# Patient Record
Sex: Female | Born: 2000 | Race: Black or African American | Hispanic: No | Marital: Single | Smoking: Never smoker
Health system: Southern US, Community
[De-identification: ages and names within clinical notes are randomized; demographics above are authoritative.]

## PROBLEM LIST (undated history)

## (undated) ENCOUNTER — Inpatient Hospital Stay (HOSPITAL_COMMUNITY): Payer: Self-pay

## (undated) VITALS — BP 130/87 | HR 89 | Temp 97.0°F | Resp 16 | Ht 59.06 in | Wt 125.7 lb

## (undated) VITALS — BP 118/87 | HR 88 | Temp 98.5°F | Resp 14 | Ht 59.06 in | Wt 119.5 lb

## (undated) DIAGNOSIS — Z9889 Other specified postprocedural states: Secondary | ICD-10-CM

## (undated) DIAGNOSIS — F319 Bipolar disorder, unspecified: Secondary | ICD-10-CM

## (undated) DIAGNOSIS — E669 Obesity, unspecified: Secondary | ICD-10-CM

## (undated) DIAGNOSIS — E301 Precocious puberty: Secondary | ICD-10-CM

## (undated) DIAGNOSIS — L83 Acanthosis nigricans: Secondary | ICD-10-CM

## (undated) DIAGNOSIS — N83202 Unspecified ovarian cyst, left side: Secondary | ICD-10-CM

## (undated) DIAGNOSIS — R1013 Epigastric pain: Secondary | ICD-10-CM

## (undated) DIAGNOSIS — F4310 Post-traumatic stress disorder, unspecified: Secondary | ICD-10-CM

## (undated) DIAGNOSIS — R112 Nausea with vomiting, unspecified: Secondary | ICD-10-CM

## (undated) DIAGNOSIS — F913 Oppositional defiant disorder: Secondary | ICD-10-CM

## (undated) DIAGNOSIS — L309 Dermatitis, unspecified: Secondary | ICD-10-CM

## (undated) DIAGNOSIS — T7840XA Allergy, unspecified, initial encounter: Secondary | ICD-10-CM

## (undated) DIAGNOSIS — F32A Depression, unspecified: Secondary | ICD-10-CM

## (undated) DIAGNOSIS — J302 Other seasonal allergic rhinitis: Secondary | ICD-10-CM

## (undated) DIAGNOSIS — F419 Anxiety disorder, unspecified: Secondary | ICD-10-CM

## (undated) DIAGNOSIS — K219 Gastro-esophageal reflux disease without esophagitis: Secondary | ICD-10-CM

## (undated) DIAGNOSIS — F329 Major depressive disorder, single episode, unspecified: Secondary | ICD-10-CM

## (undated) HISTORY — DX: Acanthosis nigricans: L83

## (undated) HISTORY — DX: Obesity, unspecified: E66.9

## (undated) HISTORY — PX: MOUTH SURGERY: SHX715

## (undated) HISTORY — DX: Epigastric pain: R10.13

## (undated) HISTORY — DX: Precocious puberty: E30.1

## (undated) HISTORY — DX: Unspecified ovarian cyst, left side: N83.202

---
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
## 2021-01-16 ENCOUNTER — Ambulatory Visit (HOSPITAL_COMMUNITY)
Admission: RE | Admit: 2021-01-16 | Discharge: 2021-01-16 | Disposition: A | Discharge status: Home / Self Care | Payer: 59 | Source: Ambulatory Visit | Attending: Family Medicine | Admitting: Family Medicine

## 2021-01-16 ENCOUNTER — Telehealth: Payer: Self-pay

## 2021-01-16 ENCOUNTER — Ambulatory Visit (INDEPENDENT_AMBULATORY_CARE_PROVIDER_SITE_OTHER): Payer: 59 | Admitting: Family Medicine

## 2021-01-16 ENCOUNTER — Other Ambulatory Visit: Payer: Self-pay

## 2021-01-16 VITALS — BP 122/74 | HR 78 | Wt 192.2 lb

## 2021-01-16 DIAGNOSIS — R079 Chest pain, unspecified: Secondary | ICD-10-CM

## 2021-01-16 DIAGNOSIS — M940 Chondrocostal junction syndrome [Tietze]: Secondary | ICD-10-CM

## 2021-01-16 MED ORDER — NAPROXEN 500 MG PO TABS: 500.0000 mg | ORAL_TABLET | Freq: Two times a day (BID) | ORAL | 0 refills | Status: AC
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
## 2021-01-16 NOTE — Patient Instructions (Signed)
I believe your chest discomfort is something we call Costochondritis.  I do NOT think this is coming from your heart.  I would like you to take Naproxen 500mg  twice a day for 2 weeks. Use heat when able, Voltaren or Icy-hot with lidocaine Please follow up if no improvement in 2 weeks or worsening pain such as difficulty breathing with exertion, pain that doesn't improve, vomiting, swelling in your legs.

## 2021-01-16 NOTE — Telephone Encounter (Signed)
Patient calls nurse line requesting to schedule appointment for chest pain. Patient reports pain has been going on for two weeks. Describes pain as sharp and stabbing in nature. Reports that she did have an episode of dizziness and SHOB when pain first started. States that pain is worse with movement and goes away with rest. Patent is currently able to speak in complete sentences and is not having SHOB. Patient states that she has had similar pain before that was evaluated by EMS, however, they told her it was likely related to anxiety.   Precepted with Dr. Owens Shark, who is agreeable to patient being evaluated in clinic this afternoon. Patient reports that she is currently at work right now and wanted to schedule for next week. Advised sooner evaluation if possible. Patient elected to schedule appointment for this afternoon and will return call to office if she is unable to keep appointment. Strict ED precautions given.   Talbot Grumbling, RN

## 2021-01-17 DIAGNOSIS — R079 Chest pain, unspecified: Secondary | ICD-10-CM | POA: Insufficient documentation

## 2021-01-17 NOTE — Assessment & Plan Note (Signed)
Acute. Daily occurrence that self resolves. Associated with movement but not exertional. EKG unremarkable. Labs in January 2020 with normal hemoglobin and electrolytes. Low suspicion for cardiac or pulmonary etiology including MI, infection or PE given history, vitals, physical exam, and EKG. Although history of GERD, this presentation seems unrelated. Orthostatics negative. Pain is reproducible on exam thus this is most likely cause of pain. Suspicious if anxiety is contributing to feeling of breathlessness and dizziness during episodes given her history of GAD and recent loss of her father from MI. Provided reassurance and recommended treatment with PO and topical NSAIDS x 2 weeks. Recommended follow up if no improvement and discussed reasons to return sooner. She voiced understanding and agreement with plan.

## 2021-02-04 ENCOUNTER — Ambulatory Visit: Payer: Medicaid Other | Admitting: Family Medicine

## 2021-02-06 ENCOUNTER — Emergency Department (HOSPITAL_COMMUNITY)
Admission: EM | Admit: 2021-02-06 | Discharge: 2021-02-06 | Discharge status: Against Medical Advice | Payer: Medicaid Other | Attending: Physician Assistant | Admitting: Physician Assistant

## 2021-02-06 ENCOUNTER — Encounter (HOSPITAL_COMMUNITY): Payer: Self-pay | Admitting: Emergency Medicine

## 2021-02-06 ENCOUNTER — Emergency Department (HOSPITAL_COMMUNITY): Payer: Medicaid Other

## 2021-02-06 DIAGNOSIS — R0982 Postnasal drip: Secondary | ICD-10-CM | POA: Diagnosis present

## 2021-02-06 DIAGNOSIS — Z5321 Procedure and treatment not carried out due to patient leaving prior to being seen by health care provider: Secondary | ICD-10-CM | POA: Insufficient documentation

## 2021-02-06 DIAGNOSIS — J029 Acute pharyngitis, unspecified: Secondary | ICD-10-CM | POA: Diagnosis not present

## 2021-02-06 DIAGNOSIS — R519 Headache, unspecified: Secondary | ICD-10-CM | POA: Insufficient documentation

## 2021-02-06 DIAGNOSIS — Z20822 Contact with and (suspected) exposure to covid-19: Secondary | ICD-10-CM | POA: Diagnosis not present

## 2021-02-06 DIAGNOSIS — R079 Chest pain, unspecified: Secondary | ICD-10-CM | POA: Diagnosis not present

## 2021-02-06 NOTE — ED Triage Notes (Signed)
Pt reports coughing, nasal drainage, sore throat, generalized aches, headache and chest pain

## 2021-02-06 NOTE — ED Provider Notes (Signed)
Emergency Medicine Provider Triage Evaluation Note  Deanna Mckay , a 20 y.o. female  was evaluated in triage.  Pt complains of uri sxs. She has had nasal congestion, sore throat, cough. She is requesting a covid test.  Review of Systems  Positive: Cough, congestion, sore throat Negative: fevers  Physical Exam  BP 140/89 (BP Location: Left Arm)   Pulse 82   Temp 98.7 F (37.1 C) (Oral)   Resp 18   SpO2 100%  Gen:   Awake, no distress   Resp:  Normal effort  MSK:   Moves extremities without difficulty  Other:    Medical Decision Making  Medically screening exam initiated at 8:33 PM.  Appropriate orders placed.  GENEVIVE PRINTUP was informed that the remainder of the evaluation will be completed by another provider, this initial triage assessment does not replace that evaluation, and the importance of remaining in the ED until their evaluation is complete.   Bishop Dublin 02/06/21 2037    Pattricia Boss, MD 02/06/21 [phone]

## 2021-02-06 NOTE — ED Notes (Signed)
Patient states she cant wait any longer and is leaving

## 2021-02-07 LAB — SARS CORONAVIRUS 2 (TAT 6-24 HRS): SARS Coronavirus 2: NEGATIVE

## 2021-03-06 ENCOUNTER — Ambulatory Visit (INDEPENDENT_AMBULATORY_CARE_PROVIDER_SITE_OTHER): Payer: Medicaid Other | Admitting: Family Medicine

## 2021-03-06 ENCOUNTER — Other Ambulatory Visit: Payer: Self-pay

## 2021-03-06 ENCOUNTER — Ambulatory Visit (INDEPENDENT_AMBULATORY_CARE_PROVIDER_SITE_OTHER): Payer: Medicaid Other

## 2021-03-06 VITALS — BP 116/68 | HR 60 | Ht 65.0 in | Wt 200.8 lb

## 2021-03-06 DIAGNOSIS — R110 Nausea: Secondary | ICD-10-CM | POA: Diagnosis not present

## 2021-03-06 DIAGNOSIS — Z23 Encounter for immunization: Secondary | ICD-10-CM | POA: Diagnosis not present

## 2021-03-06 LAB — POCT URINE PREGNANCY: Preg Test, Ur: NEGATIVE

## 2021-03-06 MED ORDER — OMEPRAZOLE 20 MG PO CPDR: 20.0000 mg | DELAYED_RELEASE_CAPSULE | Freq: Every day | ORAL | 3 refills | Status: DC

## 2021-03-06 MED ORDER — VALACYCLOVIR HCL 500 MG PO TABS
500.0000 mg | ORAL_TABLET | Freq: Every day | ORAL | 1 refills | Status: DC
Start: 2021-03-06 — End: 2022-04-30

## 2021-03-06 NOTE — Progress Notes (Signed)
  Date of Visit: 03/06/2021   SUBJECTIVE:   HPI:  Deanna Mckay presents today to discuss nausea.  Nausea - present for about 2 weeks. Decreased appetite. Belly feels uncomfortable, but not painful. History of constipation which she says is till an issue. She stopped stool softeners and miralax because these didn't work for her. Has used tea cleanses about once a month (can't provide more info on brand etc). Last BM was this morning and was not bloody but was hard, had to strain some. Discomfort in belly is up higher in epigastric area. Endorses some feelings of acid reflux. Denies vaginal discharge or pelvic pain.    OBJECTIVE:   BP 116/68   Pulse 60   Ht 5\' 5"  (1.651 m)   Wt 200 lb 12.8 oz (91.1 kg)   LMP 02/20/2021   SpO2 99%   BMI 33.41 kg/m  Gen: no acute distress, pleasant cooperative HEENT: normocephalic, atraumatic  Heart: regular rate and rhythm, no murmur Lungs: clear to auscultation bilaterally, normal work of breathing  Abdomen: soft, minimally tender to palpation. No peritoneal signs. No masses or guarding.  Neuro: alert, grossly nonfocal, speech normal  ASSESSMENT/PLAN:   Health maintenance:  -COVID booster given today  Nausea/epigastric discomfort New problem. Suspect reflux-related dyspepsia. Urine preg negative. Will start with rx of omeprazole 20mg  daily. Patient agreeable to this plan. She will let me know if not improving - room to increase PPI vs adding H2 blocker. Scheduled follow up appointment in July.  FOLLOW UP: Follow up in July as scheduled  [place] J. Ardelia Mems, [place]

## 2021-03-06 NOTE — Patient Instructions (Signed)
Take prilosec 20mg  daily - sent this in for you Let me know if it doesn't make a difference  COVID booster today  Follow up with me in July as scheduled.  Be well, Dr. Ardelia Mems

## 2021-04-10 ENCOUNTER — Ambulatory Visit: Payer: Medicaid Other | Admitting: Family Medicine

## 2021-06-24 ENCOUNTER — Ambulatory Visit (HOSPITAL_COMMUNITY): Payer: Self-pay

## 2021-07-14 ENCOUNTER — Encounter (HOSPITAL_COMMUNITY): Payer: Self-pay

## 2021-07-14 ENCOUNTER — Other Ambulatory Visit: Payer: Self-pay

## 2021-07-14 ENCOUNTER — Emergency Department (HOSPITAL_COMMUNITY)
Admission: EM | Admit: 2021-07-14 | Discharge: 2021-07-14 | Disposition: A | Discharge status: Home / Self Care | Payer: Medicaid Other | Attending: Emergency Medicine | Admitting: Emergency Medicine

## 2021-07-14 DIAGNOSIS — M79601 Pain in right arm: Secondary | ICD-10-CM | POA: Insufficient documentation

## 2021-07-14 DIAGNOSIS — M542 Cervicalgia: Secondary | ICD-10-CM | POA: Insufficient documentation

## 2021-07-14 DIAGNOSIS — Z5321 Procedure and treatment not carried out due to patient leaving prior to being seen by health care provider: Secondary | ICD-10-CM | POA: Insufficient documentation

## 2021-07-14 DIAGNOSIS — M79604 Pain in right leg: Secondary | ICD-10-CM | POA: Diagnosis not present

## 2021-07-14 NOTE — ED Triage Notes (Signed)
Pt report she had a sudden onset of a sharp pain that started from the back of her neck radiating down to her right arm and also down to her leg. Denies recent injury.

## 2021-07-14 NOTE — ED Notes (Signed)
Pt states she is leaving d/t wait time

## 2021-07-15 ENCOUNTER — Encounter: Payer: Self-pay | Admitting: Family Medicine

## 2021-07-15 ENCOUNTER — Ambulatory Visit (INDEPENDENT_AMBULATORY_CARE_PROVIDER_SITE_OTHER): Payer: Medicaid Other | Admitting: Family Medicine

## 2021-07-15 ENCOUNTER — Other Ambulatory Visit: Payer: Self-pay

## 2021-07-15 DIAGNOSIS — R519 Headache, unspecified: Secondary | ICD-10-CM

## 2021-07-15 DIAGNOSIS — K219 Gastro-esophageal reflux disease without esophagitis: Secondary | ICD-10-CM

## 2021-07-15 MED ORDER — SUCRALFATE 1 G PO TABS: 1.0000 g | ORAL_TABLET | Freq: Three times a day (TID) | ORAL | 1 refills | Status: DC

## 2021-07-15 MED ORDER — PANTOPRAZOLE SODIUM 40 MG PO TBEC: 40.0000 mg | DELAYED_RELEASE_TABLET | Freq: Every day | ORAL | 3 refills | Status: DC

## 2021-07-15 NOTE — Assessment & Plan Note (Signed)
Patient's symptoms of headache and dizziness, in the setting of low fluid intake and subjective orthostasis, are likely partially explained by dehydration. While overall neurological exam is reassuring, the decreased sensation on the right side is slightly worrisome. She had an MRI ordered by her psychiatrist in August 2022 for memory loss that was normal. Due to the normal recent MRI finding, do not suspect acute neurological process. Will push increased PO intake throughout the day and reassess symptoms at follow up.

## 2021-07-15 NOTE — Progress Notes (Addendum)
error 

## 2021-07-15 NOTE — Assessment & Plan Note (Addendum)
Patient's symptoms of pain associated with only eating, in the setting of her past diagnosis of GERD, is likely reflux pain. Will prescribe pantoprazole and sucralfate to combat acid pain and assess improvement. Also pushed consistent PO intake. If no improvement with regimen, will consider GI referral.

## 2021-07-15 NOTE — Patient Instructions (Signed)
It was great to see you again today!  Return for lab visit  Sent medications for your stomach Let's see if these help with your eating and abdominal pain Try to eat and drink consistently Let's see if that helps with your symptoms  Follow up with me in a few weeks to see how you are doing If it gets worse please come back or call us  Be well, Dr. Ardelia Mems

## 2021-07-17 ENCOUNTER — Other Ambulatory Visit: Payer: Medicaid Other

## 2021-07-17 ENCOUNTER — Other Ambulatory Visit: Payer: Self-pay

## 2021-07-17 DIAGNOSIS — K219 Gastro-esophageal reflux disease without esophagitis: Secondary | ICD-10-CM

## 2021-07-18 LAB — CMP14+EGFR
ALT: 15 IU/L (ref 0–32)
AST: 19 IU/L (ref 0–40)
Albumin/Globulin Ratio: 1.5 (ref 1.2–2.2)
Albumin: 4.1 g/dL (ref 3.9–5.0)
Alkaline Phosphatase: 89 IU/L (ref 42–106)
BUN/Creatinine Ratio: 12 (ref 9–23)
BUN: 12 mg/dL (ref 6–20)
Bilirubin Total: <0.2 mg/dL (ref 0.0–1.2)
CO2: 24 mmol/L (ref 20–29)
Calcium: 9 mg/dL (ref 8.7–10.2)
Chloride: 101 mmol/L (ref 96–106)
Creatinine, Ser: 0.99 mg/dL (ref 0.57–1.00)
Globulin, Total: 2.7 g/dL (ref 1.5–4.5)
Glucose: 87 mg/dL (ref 70–99)
Potassium: 4.5 mmol/L (ref 3.5–5.2)
Sodium: 139 mmol/L (ref 134–144)
Total Protein: 6.8 g/dL (ref 6.0–8.5)
eGFR: 84 mL/min/{1.73_m2} (ref >59)

## 2021-07-18 LAB — CBC
Hematocrit: 34.9 % (ref 34.0–46.6)
Hemoglobin: 11.7 g/dL (ref 11.1–15.9)
MCH: 31 pg (ref 26.6–33.0)
MCHC: 33.5 g/dL (ref 31.5–35.7)
MCV: 93 fL (ref 79–97)
Platelets: 321 10*3/uL (ref 150–450)
RBC: 3.77 x10E6/uL (ref 3.77–5.28)
RDW: 13.4 % (ref 11.7–15.4)
WBC: 5.5 10*3/uL (ref 3.4–10.8)

## 2021-07-18 LAB — TSH: TSH: 1.67 u[IU]/mL (ref 0.450–4.500)

## 2021-07-21 NOTE — Progress Notes (Signed)
SUBJECTIVE:   CHIEF COMPLAINT / HPI:   Deanna Mckay (MRN: [id number]) is a 20 y.o. female with a history of ODD, PTSD, bipolar disorder, anxiety, MDD, GERD, and asthma who presents for evaluation of headaches and dizziness, arm and leg pain, and stomach pain. Originally appointment was scheduled as a physical, but given her multiple symptoms we elected to make this a problem-focused visit rather than preventive visit.  Headaches and dizziness Patient states she has been having headaches with dizziness for about one month now. 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 However, these episodes can last up to 30 minutes. She experienced the dizziness today in the office when standing from a seated position. Her headaches occur all over her head and are accompanied by visual spots and smell disturbances at times. She notes that she does not drink or eat anything all morning before she goes to work. Reports had recent MRI brain without contrast a few months ago, ordered by her psychiatrist due to memory concerns. Per patient, MRI was normal.  Arm and leg pain Patient experienced left arm shooting pain and right leg shooting pain after trying to "crack" her neck to relieve pressure. She did not experience any strength changes at that time. She went to the ED yesterday (10/31) due to the instant pain but left because of wait times. The arm and leg pain have mostly resolved at this point, but she still feels tingling in her hands and feet.  Stomach pain Patient experiences pain around her stomach solely after she eats. She has felt nauseous during these episodes but has not vomited. She denies any burning pain in her chest or neck. The pain is not worse when lying down after a meal. She has a history of taking omeprazole in the past for GERD, but it did not help control her symptoms well and she has not  been taking it recently.  OBJECTIVE:   BP 133/74   Pulse 64   Ht 5\' 5"  (1.651 m)   Wt 208 lb 6.4 oz (94.5 kg)   LMP 07/09/2021 (Exact Date)   SpO2 100%   BMI 34.68 kg/m    PHYSICAL EXAM  GEN: Well-developed, in NAD HEAD: NCAT CVS: RRR, normal S1/S2, no murmurs, rubs, gallops RESP: Breathing comfortably on RA, no retractions, wheezes, rhonchi, or crackles ABD: Soft, mildly tender to palpation of RUQ and epigastric area, no organomegaly or masses NEU: CN II-XII intact, no focal deficits, no motor changes, isolated 4/5 sensation in RUE and RLE MSK: Tenderness to palpation of trapezius bilaterally EXT: Moves all extremities grossly equally    ASSESSMENT/PLAN:   Headache Patient's symptoms of headache and dizziness, in the setting of low fluid intake and subjective orthostasis, are likely partially explained by dehydration. While overall neurological exam is reassuring, the decreased sensation on the right side is slightly worrisome. She had an MRI ordered by her psychiatrist in August 2022 for memory loss that was normal. Due to the normal recent MRI finding, do not suspect acute neurological process. Will push increased PO intake throughout the day and reassess symptoms at follow up.  GERD (gastroesophageal reflux disease) Patient's symptoms of pain associated with only eating, in the setting of her past diagnosis of GERD, is likely reflux pain. Will prescribe pantoprazole and sucralfate to combat acid pain and assess improvement. Also pushed consistent PO intake.  If no improvement with regimen, will consider GI referral.   Transient cervical nerve compression Patient's nerve symptoms of shooting pains after "cracking" her neck suspected to be due to transient nerve compression. The overall resolution of this pain after the incident supports this diagnosis. Patient also endorses pain in the trapezius muscle which could have led to the feeling of needing to adjust the neck. Given  resolution, will observe and follow up at next visit.  Check labs today - patient reuqests BMET, CBC, TSH  Lyla Son [place]  Patient seen along with MS3 student [facility]. I personally evaluated this patient along with the student, and verified all aspects of the history, physical exam, and medical decision making as documented by the student. I agree with the student's documentation and have made all necessary edits.  Chrisandra Netters, MD  [place]

## 2021-07-24 ENCOUNTER — Ambulatory Visit (INDEPENDENT_AMBULATORY_CARE_PROVIDER_SITE_OTHER): Payer: Medicaid Other | Admitting: Family Medicine

## 2021-07-24 ENCOUNTER — Encounter: Payer: Self-pay | Admitting: Family Medicine

## 2021-07-24 ENCOUNTER — Other Ambulatory Visit (HOSPITAL_COMMUNITY)
Admission: RE | Admit: 2021-07-24 | Discharge: 2021-07-24 | Disposition: A | Discharge status: Home / Self Care | Payer: Medicaid Other | Source: Ambulatory Visit | Attending: Family Medicine | Admitting: Family Medicine

## 2021-07-24 ENCOUNTER — Other Ambulatory Visit: Payer: Self-pay

## 2021-07-24 VITALS — BP 110/70 | HR 71 | Temp 98.5°F | Ht 65.0 in | Wt 209.8 lb

## 2021-07-24 DIAGNOSIS — Z0000 Encounter for general adult medical examination without abnormal findings: Secondary | ICD-10-CM

## 2021-07-24 DIAGNOSIS — R519 Headache, unspecified: Secondary | ICD-10-CM | POA: Diagnosis not present

## 2021-07-24 DIAGNOSIS — Z3009 Encounter for other general counseling and advice on contraception: Secondary | ICD-10-CM | POA: Diagnosis not present

## 2021-07-24 DIAGNOSIS — M791 Myalgia, unspecified site: Secondary | ICD-10-CM | POA: Insufficient documentation

## 2021-07-24 DIAGNOSIS — Z23 Encounter for immunization: Secondary | ICD-10-CM

## 2021-07-24 DIAGNOSIS — Z113 Encounter for screening for infections with a predominantly sexual mode of transmission: Secondary | ICD-10-CM | POA: Insufficient documentation

## 2021-07-24 NOTE — Patient Instructions (Signed)
It was great to see you again today!  Stay on current medications  Call your ophthalmologist to schedule an appointment for your eye issues  STD screening today via urine and blood  See handout below with stretches for your neck. Follow up if not improving in the next month or so  Be well, Dr. Ardelia Mems   Neck Exercises Ask your health care provider which exercises are safe for you. Do exercises exactly as told by your health care provider and adjust them as directed. It is normal to feel mild stretching, pulling, tightness, or discomfort as you do these exercises. Stop right away if you feel sudden pain or your pain gets worse. Do not begin these exercises until told by your health care provider. Neck exercises can be important for many reasons. They can improve strength and maintain flexibility in your neck, which will help your upper back and prevent neck pain. Stretching exercises Rotation neck stretching  Sit in a chair or stand up. Place your feet flat on the floor, shoulder-width apart. Slowly turn your head (rotate) to the right until a slight stretch is felt. Turn it all the way to the right so you can look over your right shoulder. Do not tilt or tip your head. Hold this position for 10-30 seconds. Slowly turn your head (rotate) to the left until a slight stretch is felt. Turn it all the way to the left so you can look over your left shoulder. Do not tilt or tip your head. Hold this position for 10-30 seconds. Repeat __________ times. Complete this exercise __________ times a day. Neck retraction  Sit in a sturdy chair or stand up. Look straight ahead. Do not bend your neck. Use your fingers to push your chin backward (retraction). Do not bend your neck for this movement. Continue to face straight ahead. If you are doing the exercise properly, you will feel a slight sensation in your throat and a stretch at the back of your neck. Hold the stretch for 1-2 seconds. Repeat  __________ times. Complete this exercise __________ times a day. Strengthening exercises Neck press  Lie on your back on a firm bed or on the floor with a pillow under your head. Use your neck muscles to push your head down on the pillow and straighten your spine. Hold the position as well as you can. Keep your head facing up (in a neutral position) and your chin tucked. Slowly count to 5 while holding this position. Repeat __________ times. Complete this exercise __________ times a day. Isometrics These are exercises in which you strengthen the muscles in your neck while keeping your neck still (isometrics). Sit in a supportive chair and place your hand on your forehead. Keep your head and face facing straight ahead. Do not flex or extend your neck while doing isometrics. Push forward with your head and neck while pushing back with your hand. Hold for 10 seconds. Do the sequence again, this time putting your hand against the back of your head. Use your head and neck to push backward against the hand pressure. Finally, do the same exercise on either side of your head, pushing sideways against the pressure of your hand. Repeat __________ times. Complete this exercise __________ times a day. Prone head lifts  Lie face-down (prone position), resting on your elbows so that your chest and upper back are raised. Start with your head facing downward, near your chest. Position your chin either on or near your chest. Slowly lift your  head upward. Lift until you are looking straight ahead. Then continue lifting your head as far back as you can comfortably stretch. Hold your head up for 5 seconds. Then slowly lower it to your starting position. Repeat __________ times. Complete this exercise __________ times a day. Supine head lifts  Lie on your back (supine position), bending your knees to point to the ceiling and keeping your feet flat on the floor. Lift your head slowly off the floor, raising your  chin toward your chest. Hold for 5 seconds. Repeat __________ times. Complete this exercise __________ times a day. Scapular retraction  Stand with your arms at your sides. Look straight ahead. Slowly pull both shoulders (scapulae) backward and downward (retraction) until you feel a stretch between your shoulder blades in your upper back. Hold for 10-30 seconds. Relax and repeat. Repeat __________ times. Complete this exercise __________ times a day. Contact a health care provider if: Your neck pain or discomfort gets worse when you do an exercise. Your neck pain or discomfort does not improve within 2 hours after you exercise. If you have any of these problems, stop exercising right away. Do not do the exercises again unless your health care provider says that you can. Get help right away if: You develop sudden, severe neck pain. If this happens, stop exercising right away. Do not do the exercises again unless your health care provider says that you can. This information is not intended to replace advice given to you by your health care provider. Make sure you discuss any questions you have with your health care provider. Document Revised: 02/25/2021 Document Reviewed: 02/25/2021 Elsevier Patient Education  [place].

## 2021-07-24 NOTE — Assessment & Plan Note (Signed)
Patient's headaches have improved with improve PO intake. Will continue consistent eating and drinking throughout the day.

## 2021-07-24 NOTE — Assessment & Plan Note (Signed)
Patient's symptoms of stomach pain and reflux have improved on sucralfate and pantoprazole. Continue current regimen.

## 2021-07-24 NOTE — Assessment & Plan Note (Signed)
Patient's symptoms of pain in the trapezius distribution on palpation and occasional neuropathic pain with muscle movement likely due to muscle tension of the trapezius. Given patient's constant use of these muscles and overall stressful work environment, advised patient to try shoulder and neck muscle stretches. Provided educational handout. If symptoms persist or worsen by next visit, will consider muscle relaxant vs physical therapy.

## 2021-07-24 NOTE — Progress Notes (Signed)
SUBJECTIVE:   CHIEF COMPLAINT / HPI:   Deanna Mckay (MRN: [id number]) is a 20 y.o. female with a history of ODD, PTSD, bipolar disorder, anxiety, MDD, GERD, and asthma who presents for a physical and follow up of issues from recent visit.  Headaches and dizziness Patient had been trying to eat and drink more consistently since her last visit. She bought a reusable jug of water that has helped increased her intake. Since incorporating these changes, her headaches and feelings of dizziness have improved.  GERD Patient has been taking pantoprazole and sucralfate as prescribed in addition to eating more regularly. She says her symptoms of reflux and stomach pain have improved with these changes.  Neck pain with shooting pains Patient feels that her neck pain and shooting pains down her left arm and right leg are similar to last time. She states the shock feels "colder" now. 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 It overall resolves at rest. She has tried some self stretching exercises with minimal relief.  Vision changes Patient states her vision feels "shaky." She notes that when she looks at objects, she feels they have an "irregular" shape. She can walk and drive overall fine, though she feels like driving at times is difficult. She does have glasses, but she has not worn them in years. She has also not seen her eye doctor at [facility] in years.  OBJECTIVE:   BP 110/70   Pulse 71   Temp 98.5 F (36.9 C)   Ht 5\' 5"  (1.651 m)   Wt 209 lb 12.8 oz (95.2 kg)   LMP 07/09/2021 (Exact Date)   SpO2 100%   BMI 34.91 kg/m    PHYSICAL EXAM  GEN: Well-developed, in NAD HEAD: NCAT, neck supple, goiter noted EENT: pupils equal round and reactive to light, EOMI CVS: RRR, normal S1/S2, no murmurs, rubs, gallops RESP: Breathing comfortably on RA, no retractions, wheezes, rhonchi, or crackles ABD: Soft, nontender to  palpation, no organomegaly or masses, normoactive bowel sounds SKIN: No obvious lesions or rashes  NEU: CN II-XII intact throughout, no focal deficit, muscle and sensation intact throughout EXT: Moves all extremities grossly equally    ASSESSMENT/PLAN:   Headache Patient's headaches have improved with improve PO intake. Will continue consistent eating and drinking throughout the day.  GERD (gastroesophageal reflux disease) Patient's symptoms of stomach pain and reflux have improved on sucralfate and pantoprazole. Continue current regimen.  Muscle tension pain of trapezius Patient's symptoms of pain in the trapezius distribution on palpation and occasional neuropathic pain with muscle movement likely due to muscle tension of the trapezius. Given patient's constant use of these muscles and overall stressful work environment, advised patient to try shoulder and neck muscle stretches. Provided educational handout. If symptoms persist or worsen by next visit, will consider muscle relaxant vs physical therapy.  Contraception management Patient is sexually active with one female partner and not using any barrier or contraceptive methods. Discussed high risk of pregnancy, and patient verbalizes understanding. She would like to defer beginning contraceptives at this time.   Vision changes Vision testing in office 20/20, and gross visual deficits not appreciated on exam. Given history of glasses and previous visits with [facility], recommended following up with their office for further evaluation.  Health maintenance Patient amenable to STI testing. Urine chlamydia and gonorrhea, HIV, and RPR ordered. Will follow up results. Patient also received  COVID-19 booster without incident but declined the flu vaccine at this time.  [name]  Patient seen along with MS3 student [facility]. I personally evaluated this patient along with the  student, and verified all aspects of the history, physical exam, and medical decision making as documented by the student. I agree with the student's documentation and have made all necessary edits.  Chrisandra Netters, MD  [place]

## 2021-07-25 LAB — URINE CYTOLOGY ANCILLARY ONLY
Chlamydia: NEGATIVE
Comment: NEGATIVE
Comment: NEGATIVE
Comment: NORMAL
Neisseria Gonorrhea: NEGATIVE
Trichomonas: NEGATIVE

## 2021-07-25 LAB — HIV ANTIBODY (ROUTINE TESTING W REFLEX): HIV Screen 4th Generation wRfx: NONREACTIVE

## 2021-07-25 LAB — RPR: RPR Ser Ql: NONREACTIVE

## 2021-07-29 ENCOUNTER — Other Ambulatory Visit: Payer: Self-pay

## 2021-07-29 ENCOUNTER — Ambulatory Visit (INDEPENDENT_AMBULATORY_CARE_PROVIDER_SITE_OTHER): Payer: Medicaid Other

## 2021-07-29 DIAGNOSIS — Z23 Encounter for immunization: Secondary | ICD-10-CM

## 2021-07-31 NOTE — Assessment & Plan Note (Signed)
Patient is sexually active with one female partner and not using any barrier or contraceptive methods. Discussed high risk of pregnancy, and patient verbalizes understanding. She would like to defer beginning contraceptives at this time.

## 2021-09-17 ENCOUNTER — Other Ambulatory Visit: Payer: Self-pay

## 2021-09-17 ENCOUNTER — Encounter: Payer: Self-pay | Admitting: Family Medicine

## 2021-09-17 ENCOUNTER — Other Ambulatory Visit (HOSPITAL_COMMUNITY)
Admission: RE | Admit: 2021-09-17 | Discharge: 2021-09-17 | Disposition: A | Discharge status: Home / Self Care | Payer: Medicaid Other | Source: Ambulatory Visit | Attending: Family Medicine | Admitting: Family Medicine

## 2021-09-17 ENCOUNTER — Ambulatory Visit (INDEPENDENT_AMBULATORY_CARE_PROVIDER_SITE_OTHER): Payer: Medicaid Other | Admitting: Family Medicine

## 2021-09-17 VITALS — BP 137/78 | HR 64 | Temp 98.3°F | Ht 65.0 in | Wt 207.4 lb

## 2021-09-17 DIAGNOSIS — N898 Other specified noninflammatory disorders of vagina: Secondary | ICD-10-CM

## 2021-09-17 DIAGNOSIS — Z1159 Encounter for screening for other viral diseases: Secondary | ICD-10-CM

## 2021-09-17 DIAGNOSIS — Z113 Encounter for screening for infections with a predominantly sexual mode of transmission: Secondary | ICD-10-CM

## 2021-09-17 DIAGNOSIS — R300 Dysuria: Secondary | ICD-10-CM

## 2021-09-17 LAB — POCT URINALYSIS DIP (MANUAL ENTRY)
Bilirubin, UA: NEGATIVE
Blood, UA: NEGATIVE
Glucose, UA: NEGATIVE mg/dL
Ketones, POC UA: NEGATIVE mg/dL
Nitrite, UA: NEGATIVE
Protein Ur, POC: NEGATIVE mg/dL
Spec Grav, UA: 1.02 (ref 1.010–1.025)
Urobilinogen, UA: 0.2 E.U./dL
pH, UA: 6.5 (ref 5.0–8.0)

## 2021-09-17 LAB — POCT WET PREP (WET MOUNT)
Clue Cells Wet Prep Whiff POC: NEGATIVE
Trichomonas Wet Prep HPF POC: ABSENT
WBC, Wet Prep HPF POC: >20

## 2021-09-17 LAB — POCT UA - MICROSCOPIC ONLY

## 2021-09-17 NOTE — Patient Instructions (Addendum)
Thank you for coming in today.  I will send off your urine to be cultured to see if it grows any infection.  If there is an infection I will notify you.  I will also notify you if there are any other positive labs and if I need to send in treatment.  I would suggest following up with your primary care doctor later in the year for your blood pressure if it continues to be elevated.  Dr. Janus Molder

## 2021-09-17 NOTE — Progress Notes (Signed)
SUBJECTIVE:   CHIEF COMPLAINT / HPI:   [name] is a 21 yo F who presents for the reasons below.  Dysuria She has been experiencing several days of burning sensation with urination as well as vaginal odor.  She also expresses having a yellow to white discharge.  She is currently sexually active with 1 partner.  She does not use condoms at this time.  She is not currently on birth control.  She is not interested in birth control.  She expresses that she is interested in becoming pregnant soon.  LMP 12/21.  Desires STD testing.  PERTINENT  PMH / PSH: History of genital herpes, history of PID  OBJECTIVE:   BP 137/78    Pulse 64    Temp 98.3 F (36.8 C)    Ht 5\' 5"  (1.651 m)    Wt 207 lb 6.4 oz (94.1 kg)    SpO2 100%    BMI 34.51 kg/m   Results for orders placed or performed in visit on 09/17/21 (from the past 24 hour(s))  POCT urinalysis dipstick     Status: Abnormal   Collection Time: 09/17/21 10:00 AM  Result Value Ref Range   Color, UA yellow yellow   Clarity, UA clear clear   Glucose, UA negative negative mg/dL   Bilirubin, UA negative negative   Ketones, POC UA negative negative mg/dL   Spec Grav, UA 1.020 1.010 - 1.025   Blood, UA negative negative   pH, UA 6.5 5.0 - 8.0   Protein Ur, POC negative negative mg/dL   Urobilinogen, UA 0.2 0.2 or 1.0 E.U./dL   Nitrite, UA Negative Negative   Leukocytes, UA Trace (A) Negative  POCT UA - Microscopic Only     Status: Abnormal   Collection Time: 09/17/21 10:00 AM  Result Value Ref Range   WBC, Ur, HPF, POC 0-3 0 - 5   RBC, Urine, Miroscopic NONE 0 - 2   Bacteria, U Microscopic FEW None - Trace   Epithelial cells, urine per micros 5-10   Physical Exam Vitals reviewed. Exam conducted with a chaperone present.  Constitutional:      General: She is not in acute distress.    Appearance: She is not ill-appearing or toxic-appearing.  Genitourinary:    Exam position: Lithotomy position.     Pubic Area: No rash.      Labia:         Right: No rash, tenderness, lesion or injury.        Left: No rash, tenderness or lesion.      Urethra: No urethral pain or urethral swelling.     Vagina: Vaginal discharge present. No tenderness or lesions.     Cervix: Discharge present. No cervical motion tenderness, friability, lesion, erythema or cervical bleeding.  Neurological:     Mental Status: She is alert and oriented to person, place, and time.  Psychiatric:        Mood and Affect: Mood normal.        Behavior: Behavior normal.   ASSESSMENT/PLAN:   Dysuria   Vaginal discharge Afebrile.  Sexually active without condom use.  Recent LMP, reasonably not pregnant.  No lesions on exam but yellow to white discharge present.  UA unremarkable we will culture.  STD testing performed.  Will follow results and treat appropriately. - POCT urinalysis dipstick - Urine Culture - POCT Wet Prep [facility]) - Cervicovaginal ancillary only - HIV antibody (with reflex) - RPR  Shyne Resch Aut[place], DO [facility]  [place]

## 2021-09-18 LAB — RPR: RPR Ser Ql: NONREACTIVE

## 2021-09-18 LAB — HIV ANTIBODY (ROUTINE TESTING W REFLEX): HIV Screen 4th Generation wRfx: NONREACTIVE

## 2021-09-19 ENCOUNTER — Telehealth: Payer: Self-pay | Admitting: Family Medicine

## 2021-09-19 DIAGNOSIS — N390 Urinary tract infection, site not specified: Secondary | ICD-10-CM

## 2021-09-19 LAB — CERVICOVAGINAL ANCILLARY ONLY
Chlamydia: NEGATIVE
Comment: NEGATIVE
Comment: NORMAL
Neisseria Gonorrhea: NEGATIVE

## 2021-09-19 MED ORDER — NITROFURANTOIN MONOHYD MACRO 100 MG PO CAPS: 100.0000 mg | ORAL_CAPSULE | Freq: Two times a day (BID) | ORAL | 0 refills | Status: DC

## 2021-09-19 NOTE — Telephone Encounter (Signed)
Discussed UTI tx with patient. Will send rx to pharmacy.   [place], DO 09/19/2021, 2:47 PM PGY-3, [place]

## 2021-09-20 LAB — URINE CULTURE

## 2021-09-21 ENCOUNTER — Telehealth: Payer: Self-pay | Admitting: Family Medicine

## 2021-09-21 DIAGNOSIS — N3000 Acute cystitis without hematuria: Secondary | ICD-10-CM

## 2021-09-21 MED ORDER — CIPROFLOXACIN HCL 500 MG PO TABS: 500.0000 mg | ORAL_TABLET | Freq: Two times a day (BID) | ORAL | 0 refills | Status: DC

## 2021-09-21 MED ORDER — DOXYCYCLINE HYCLATE 100 MG PO TABS: 100.0000 mg | ORAL_TABLET | Freq: Two times a day (BID) | ORAL | 0 refills | Status: DC

## 2021-09-21 NOTE — Addendum Note (Signed)
Addended by: Caralee Ates on: 09/21/2021 05:28 PM   Modules accepted: Orders

## 2021-09-21 NOTE — Telephone Encounter (Addendum)
Discontinued macrobid. Rx for doxy was sent (this is resistant as well). Finally sent cipro to pharmacy. Patient made aware and has not picked up macrobid. Attempted to call pharmacy to cancel doxy script. They are closed. Patient is aware she should pick up cipro. She voiced understanding.   Gerlene Fee, DO 09/21/2021, 5:22 PM PGY-3, [place]

## 2021-09-23 MED ORDER — CIPROFLOXACIN HCL 500 MG PO TABS: 500.0000 mg | ORAL_TABLET | Freq: Two times a day (BID) | ORAL | 0 refills | Status: DC

## 2021-09-23 NOTE — Telephone Encounter (Signed)
Patient calls nurse line regarding issues with picking up cipro. Informed patient that rx had been sent to the [facility] on [address]. Patient reports that she no longer uses this pharmacy and needs it taken out of our system.   Called and canceled rx at [facility]. Resent to [facility] per patient request.   Talbot Grumbling, RN

## 2021-09-23 NOTE — Addendum Note (Signed)
Addended by: Talbot Grumbling on: 09/23/2021 09:33 AM   Modules accepted: Orders

## 2021-12-30 ENCOUNTER — Ambulatory Visit (INDEPENDENT_AMBULATORY_CARE_PROVIDER_SITE_OTHER): Payer: Medicaid Other | Admitting: Podiatry

## 2021-12-30 ENCOUNTER — Encounter: Payer: Self-pay | Admitting: Podiatry

## 2021-12-30 DIAGNOSIS — L600 Ingrowing nail: Secondary | ICD-10-CM

## 2021-12-30 DIAGNOSIS — Z79899 Other long term (current) drug therapy: Secondary | ICD-10-CM

## 2021-12-30 DIAGNOSIS — B351 Tinea unguium: Secondary | ICD-10-CM

## 2021-12-30 NOTE — Patient Instructions (Addendum)
Place 1/4 cup of epsom salts in a quart of warm tap water.  Submerge your foot or feet in the solution and soak for 20 minutes.  This soak should be done twice a day.  Next, remove your foot or feet from solution, blot dry the affected area. Apply ointment and cover if instructed by your doctor.  ? ?IF YOUR SKIN BECOMES IRRITATED WHILE USING THESE INSTRUCTIONS, IT IS OKAY TO SWITCH TO  WHITE VINEGAR AND WATER.  ?As another alternative soak, you may use antibacterial soap and water. ? ?Monitor for any signs/symptoms of infection. Call the office immediately if any occur or go directly to the emergency room. Call with any questions/concerns.  ? ?Terbinafine Tablets ?What is this medication? ?TERBINAFINE (TER bin a feen) treats fungal infections of the nails. It belongs to a group of medications called antifungals. It will not treat infections caused by bacteria or viruses. ?This medicine may be used for other purposes; ask your health care provider or pharmacist if you have questions. ?COMMON BRAND NAME(S): Lamisil, Terbinex ?What should I tell my care team before I take this medication? ?They need to know if you have any of these conditions: ?Liver disease ?An unusual or allergic reaction to terbinafine, other medications, foods, dyes, or preservatives ?Pregnant or trying to get pregnant ?Breast-feeding ?How should I use this medication? ?Take this medication by mouth with water. Take it as directed on the prescription label at the same time every day. You can take it with or without food. If it upsets your stomach, take it with food. Keep taking it unless your care team tells you to stop. ?A special MedGuide will be given to you by the pharmacist with each prescription and refill. Be sure to read this information carefully each time. ?Talk to your care team regarding the use of this medication in children. Special care may be needed. ?Overdosage: If you think you have taken too much of this medicine contact a poison  control center or emergency room at once. ?NOTE: This medicine is only for you. Do not share this medicine with others. ?What if I miss a dose? ?If you miss a dose, take it as soon as you can unless it is more than 4 hours late. If it is more than 4 hours late, skip the missed dose. Take the next dose at the normal time. ?What may interact with this medication? ?Do not take this medication with any of the following: ?Pimozide ?Thioridazine ?This medication may also interact with the following: ?Beta blockers ?Caffeine ?Certain medications for mental health conditions ?Cimetidine ?Cyclosporine ?Medications for fungal infections like fluconazole and ketoconazole ?Medications for irregular heartbeat like amiodarone, flecainide and propafenone ?Rifampin ?Warfarin ?This list may not describe all possible interactions. Give your health care provider a list of all the medicines, herbs, non-prescription drugs, or dietary supplements you use. Also tell them if you smoke, drink alcohol, or use illegal drugs. Some items may interact with your medicine. ?What should I watch for while using this medication? ?Visit your care team for regular checks on your progress. You may need blood work while you are taking this medication. It may be some time before you see the benefit from this medication. ?This medication may cause serious skin reactions. They can happen weeks to months after starting the medication. Contact your care team right away if you notice fevers or flu-like symptoms with a rash. The rash may be red or purple and then turn into blisters or peeling of the  skin. Or, you might notice a red rash with swelling of the face, lips or lymph nodes in your neck or under your arms. ?This medication can make you more sensitive to the sun. Keep out of the sun, If you cannot avoid being in the sun, wear protective clothing and sunscreen. Do not use sun lamps or tanning beds/booths. ?What side effects may I notice from receiving  this medication? ?Side effects that you should report to your care team as soon as possible: ?Allergic reactions--skin rash, itching, hives, swelling of the face, lips, tongue, or throat ?Change in sense of smell ?Change in taste ?Infection--fever, chills, cough, or sore throat ?Liver injury--right upper belly pain, loss of appetite, nausea, light-colored stool, dark yellow or brown urine, yellowing skin or eyes, unusual weakness or fatigue ?Low red blood cell level--unusual weakness or fatigue, dizziness, headache, trouble breathing ?Lupus-like syndrome--joint pain, swelling, or stiffness, butterfly-shaped rash on the face, rashes that get worse in the sun, fever, unusual weakness or fatigue ?Rash, fever, and swollen lymph nodes ?Redness, blistering, peeling, or loosening of the skin, including inside the mouth ?Unusual bruising or bleeding ?Worsening mood, feelings of depression ?Side effects that usually do not require medical attention (report to your care team if they continue or are bothersome): ?Diarrhea ?Gas ?Headache ?Nausea ?Stomach pain ?Upset stomach ?This list may not describe all possible side effects. Call your doctor for medical advice about side effects. You may report side effects to FDA at 1-800-FDA-1088. ?Where should I keep my medication? ?Keep out of the reach of children and pets. ?Store between 20 and 25 degrees C (68 and 77 degrees F). Protect from light. Get rid of any unused medication after the expiration date. ?To get rid of medications that are no longer needed or have expired: ?Take the medication to a medication take-back program. Check with your pharmacy or law enforcement to find a location. ?If you cannot return the medication, check the label or package insert to see if the medication should be thrown out in the garbage or flushed down the toilet. If you are not sure, ask your care team. If it is safe to put it in the trash, take the medication out of the container. Mix the  medication with cat litter, dirt, coffee grounds, or other unwanted substance. Seal the mixture in a bag or container. Put it in the trash. ?NOTE: This sheet is a summary. It may not cover all possible information. If you have questions about this medicine, talk to your doctor, pharmacist, or health care provider. ?? 2023 Elsevier/Gold Standard (2021-04-16 00:00:00) ? ?

## 2021-12-31 ENCOUNTER — Other Ambulatory Visit: Payer: Self-pay | Admitting: Podiatry

## 2021-12-31 DIAGNOSIS — Z79899 Other long term (current) drug therapy: Secondary | ICD-10-CM

## 2021-12-31 LAB — CBC WITH DIFFERENTIAL/PLATELET
Absolute Monocytes: 440 cells/uL (ref 200–950)
Basophils Absolute: 10 cells/uL (ref 0–200)
Basophils Relative: 0.2 %
Eosinophils Absolute: 90 cells/uL (ref 15–500)
Eosinophils Relative: 1.8 %
HCT: 36.9 % (ref 35.0–45.0)
Hemoglobin: 12.1 g/dL (ref 11.7–15.5)
Lymphs Abs: 1825 cells/uL (ref 850–3900)
MCH: 31 pg (ref 27.0–33.0)
MCHC: 32.8 g/dL (ref 32.0–36.0)
MCV: 94.6 fL (ref 80.0–100.0)
MPV: 11.4 fL (ref 7.5–12.5)
Monocytes Relative: 8.8 %
Neutro Abs: 2635 cells/uL (ref 1500–7800)
Neutrophils Relative %: 52.7 %
Platelets: 322 10*3/uL (ref 140–400)
RBC: 3.9 10*6/uL (ref 3.80–5.10)
RDW: 13.3 % (ref 11.0–15.0)
Total Lymphocyte: 36.5 %
WBC: 5 10*3/uL (ref 3.8–10.8)

## 2021-12-31 LAB — HEPATIC FUNCTION PANEL
AG Ratio: 1.3 (calc) (ref 1.0–2.5)
ALT: 11 U/L (ref 6–29)
AST: 13 U/L (ref 10–30)
Albumin: 3.9 g/dL (ref 3.6–5.1)
Alkaline phosphatase (APISO): 68 U/L (ref 31–125)
Bilirubin, Direct: 0 mg/dL (ref 0.0–0.2)
Globulin: 3 g/dL (calc) (ref 1.9–3.7)
Indirect Bilirubin: 0.2 mg/dL (calc) (ref 0.2–1.2)
Total Bilirubin: 0.2 mg/dL (ref 0.2–1.2)
Total Protein: 6.9 g/dL (ref 6.1–8.1)

## 2021-12-31 MED ORDER — TERBINAFINE HCL 250 MG PO TABS: 250.0000 mg | ORAL_TABLET | Freq: Every day | ORAL | 0 refills | Status: DC

## 2021-12-31 NOTE — Progress Notes (Signed)
Lamisil ordered and repeat blood work to be done in 4-6 weeks.  ?

## 2022-01-03 NOTE — Progress Notes (Signed)
Subjective: ?21 year old female presents the office today with new complaints of pain given her right big toenail is becoming ingrown and she is also because about possible fungus in the nail.  She said the nail came off in March and has come in thick and discolored.  Hurts to touch at times.  She states that she had treatment approximate 5 years ago for this and she has had previous ingrown toenail procedures.  No swelling redness or any drainage at this time. ? ?Objective: ?AAO x3, NAD ?DP/PT pulses palpable bilaterally, CRT less than 3 seconds ?Incurvation present to the right hallux toenail the nail itself is hypertrophic, dystrophic with yellow, brown discoloration.  There is no surrounding hyperpigmentation.  Interim the nails are also dystrophic and discolored.  There is tenderness palpation of the right hallux toenail without any edema, erythema. No pain with calf compression, swelling, warmth, erythema ? ?Assessment: ?Onychomycosis, ingrown toenail right hallux ? ?Plan: ?-All treatment options discussed with the patient including all alternatives, risks, complications.  ?-Discussed with him treatment options for nail fungus.  She was proceed with oral Lamisil after discussing risk.  Check a CBC and LFT.  Discussed side effects and that should any issues occur to stop taking the medication and let me know. ?-Patient is requesting total nail avulsion today as well.  I discussed the procedure as well as postoperative course and there is no damage to the nails and the common better.  I cleaned the skin with alcohol and 3 cc of lidocaine, Marcaine plain was infiltrated in a regional block fashion.  Once anesthetized the skin was prepped with Betadine and a tourniquet applied.  The nail was then removed in total without any complications.  There is no purulence or signs of infection underneath it.  The wound bed is healthy.  I irrigated the wound with alcohol.  Antibiotic ointment and a bandage applied.   Tourniquet was released and there is found to be an immediate capillary fill time.  She tolerated the procedure well any complications.  Postprocedure instructions discussed. ?-Patient encouraged to call the office with any questions, concerns, change in symptoms.  ? ?Trula Slade DPM ? ?

## 2022-01-13 ENCOUNTER — Ambulatory Visit (INDEPENDENT_AMBULATORY_CARE_PROVIDER_SITE_OTHER): Payer: Medicaid Other | Admitting: Podiatry

## 2022-01-13 DIAGNOSIS — L600 Ingrowing nail: Secondary | ICD-10-CM

## 2022-01-13 DIAGNOSIS — B351 Tinea unguium: Secondary | ICD-10-CM | POA: Diagnosis not present

## 2022-01-13 MED ORDER — FLUCONAZOLE 150 MG PO TABS: 150.0000 mg | ORAL_TABLET | ORAL | 0 refills | Status: DC

## 2022-01-13 NOTE — Patient Instructions (Signed)
Fluconazole Tablets ?What is this medication? ?FLUCONAZOLE (floo KON na zole) prevents and treats fungal or yeast infections. It belongs to a group of medications called antifungals. It will not prevent or treat colds, the flu, or infections caused by bacteria or viruses. ?This medicine may be used for other purposes; ask your health care provider or pharmacist if you have questions. ?COMMON BRAND NAME(S): Diflucan ?What should I tell my care team before I take this medication? ?They need to know if you have any of these conditions: ?Irregular heartbeat or rhythm ?Kidney disease ?Liver disease ?Low levels of potassium in the blood ?An unusual or allergic reaction to fluconazole, other azole antifungals, medications, foods, dyes, or preservatives ?Pregnant or trying to get pregnant ?Breast-feeding ?How should I use this medication? ?Take this medication by mouth. Follow the directions on the prescription label. Do not take your medication more often than directed. ?Talk to your care team about the use of this medication in children. Special care may be needed. This medication has been used in children as young as 65 months of age. ?Overdosage: If you think you have taken too much of this medicine contact a poison control center or emergency room at once. ?NOTE: This medicine is only for you. Do not share this medicine with others. ?What if I miss a dose? ?If you miss a dose, take it as soon as you can. If it is almost time for your next dose, take only that dose. Do not take double or extra doses. ?What may interact with this medication? ?Do not take this medication with any of the following: ?Adagrasib ?Flibanserin ?Lomitapide ?Lonafarnib ?Other medications that cause heart rhythm changes ?Triazolam ?This medication may also interact with the following: ?Abrocitinib ?Certain antibiotics, such as rifabutin or rifampin ?Certain antivirals for HIV or hepatitis ?Certain medications for blood pressure, heart disease,  irregular heartbeat ?Certain medications for cholesterol, such as atorvastatin, lovastatin, simvastatin ?Certain medications for depression, such as amitriptyline or nortriptyline ?Certain medications for diabetes, such as glipizide or glyburide ?Certain medications for seizures, such as carbamazepine or phenytoin ?Certain medications that treat or prevent blood clots, such as warfarin ?Certain opioid medications for pain, such as alfentanil, fentanyl, methadone ?Cyclophosphamide ?Cyclosporine ?Ibrutinib ?Lemborexant ?Midazolam ?NSAIDS, medications for pain and inflammation, such as ibuprofen or naproxen ?Olaparib ?Sirolimus ?Steroid medications, such as prednisone ?Tacrolimus ?Theophylline ?Tofacitinib ?Tolvaptan ?Vinblastine ?Vincristine ?Vitamin A ?Voriconazole ?This list may not describe all possible interactions. Give your health care provider a list of all the medicines, herbs, non-prescription drugs, or dietary supplements you use. Also tell them if you smoke, drink alcohol, or use illegal drugs. Some items may interact with your medicine. ?What should I watch for while using this medication? ?Visit your care team for regular checkups. If you are taking this medication for a long time you may need blood work. Tell your care team if your symptoms do not improve. Some fungal infections need many weeks or months of treatment to cure. ?Alcohol can increase possible damage to your liver. Avoid alcoholic drinks. ?If you have a vaginal infection, do not have sex until you have finished your treatment. You can wear a sanitary napkin. Do not use tampons. Wear freshly washed cotton, not synthetic, panties. ?What side effects may I notice from receiving this medication? ?Side effects that you should report to your care team as soon as possible: ?Allergic reactions--skin rash, itching, hives, swelling of the face, lips, tongue, or throat ?Heart rhythm changes--fast or irregular heartbeat, dizziness, feeling faint or  lightheaded,  chest pain, trouble breathing ?Liver injury--right upper belly pain, loss of appetite, nausea, light-colored stool, dark yellow or brown urine, yellowing skin or eyes, unusual weakness or fatigue ?Low adrenal gland function--nausea, vomiting, loss of appetite, unusual weakness or fatigue, dizziness ?Rash, fever, and swollen lymph nodes ?Redness, blistering, peeling, or loosening of the skin, including inside the mouth ?Seizures ?Side effects that usually do not require medical attention (report to your care team if they continue or are bothersome): ?Change in taste ?Diarrhea ?Dizziness ?Headache ?Nausea ?Stomach pain ?This list may not describe all possible side effects. Call your doctor for medical advice about side effects. You may report side effects to FDA at 1-800-FDA-1088. ?Where should I keep my medication? ?Keep out of the reach of children. ?Store at room temperature below 30 degrees C (86 degrees F). Throw away any medication after the expiration date. ?NOTE: This sheet is a summary. It may not cover all possible information. If you have questions about this medicine, talk to your doctor, pharmacist, or health care provider. ?? 2023 Elsevier/Gold Standard (2021-10-02 00:00:00) ? ?

## 2022-01-15 ENCOUNTER — Telehealth: Payer: Self-pay | Admitting: Family Medicine

## 2022-01-15 NOTE — Telephone Encounter (Signed)
Patient called requesting an appointment for blood in her stool. Patients states the amount is a lot, as if it would be the first day of her period. Discussed with Dr. Ardelia Mems, who happened to be in the office at the time of the call and she advised the patient be seen in the ED. Patient was appreciative of this information and said she would go to the ED.  ? ?Routing to PCP. ?

## 2022-01-15 NOTE — Addendum Note (Signed)
Addended by: Celesta Gentile R on: 01/15/2022 01:27 PM ? ? Modules accepted: Orders ? ?

## 2022-01-15 NOTE — Progress Notes (Addendum)
Subjective: ?21 year old female presents the office today for follow-up evaluation after undergoing total nail avulsion on the right hallux.  She states it is healing well.  She is also been taking Lamisil causing upset stomach.  Denies any drainage or pus insertion sites.  She has no new concerns today. ? ?Objective: ?AAO x3, NAD ?DP/PT pulses palpable bilaterally, CRT less than 3 seconds ?Status post total nail avulsion right hallux toenail.  Appears to be healed.  Minimal scabbing is present.  There is no drainage or pus.  No surrounding erythema, ascending cellulitis.  No fluctuation or crepitation.  No malodor.  The nails are mildly dystrophic and yellow discoloration.  No pain the other toenails.  No open lesions. ?No pain with calf compression, swelling, warmth, erythema ? ?Assessment: ?Onychomycosis, ingrown toenail right hallux ? ?Plan: ?-All treatment options discussed with the patient including all alternatives, risks, complications.  ?-Procedure site appears to be healed.  Due to issues with Lamisil going to stop this and I started fluconazole once weekly. ?Return in about 3 months (around 04/15/2022), or if symptoms worsen or fail to improve, for fugus . ? ?Trula Slade DPM ?

## 2022-01-15 NOTE — Telephone Encounter (Signed)
Agree with plan to send to ED for large volume blood in stool ?[name], MD  ?

## 2022-02-17 ENCOUNTER — Encounter: Payer: Self-pay | Admitting: *Deleted

## 2022-03-04 ENCOUNTER — Encounter: Payer: Self-pay | Admitting: Family Medicine

## 2022-03-04 ENCOUNTER — Ambulatory Visit (INDEPENDENT_AMBULATORY_CARE_PROVIDER_SITE_OTHER): Payer: Medicaid Other | Admitting: Family Medicine

## 2022-03-04 ENCOUNTER — Other Ambulatory Visit: Payer: Self-pay

## 2022-03-04 ENCOUNTER — Other Ambulatory Visit (HOSPITAL_COMMUNITY)
Admission: RE | Admit: 2022-03-04 | Discharge: 2022-03-04 | Disposition: A | Discharge status: Home / Self Care | Payer: Medicaid Other | Source: Ambulatory Visit | Attending: Family Medicine | Admitting: Family Medicine

## 2022-03-04 VITALS — BP 128/76 | HR 67 | Wt 206.0 lb

## 2022-03-04 DIAGNOSIS — N898 Other specified noninflammatory disorders of vagina: Secondary | ICD-10-CM | POA: Diagnosis not present

## 2022-03-04 DIAGNOSIS — R300 Dysuria: Secondary | ICD-10-CM | POA: Diagnosis present

## 2022-03-04 LAB — POCT WET PREP (WET MOUNT)
Clue Cells Wet Prep Whiff POC: NEGATIVE
Trichomonas Wet Prep HPF POC: ABSENT

## 2022-03-04 LAB — POCT URINALYSIS DIP (MANUAL ENTRY)
Bilirubin, UA: NEGATIVE
Blood, UA: NEGATIVE
Glucose, UA: NEGATIVE mg/dL
Ketones, POC UA: NEGATIVE mg/dL
Nitrite, UA: NEGATIVE
Protein Ur, POC: NEGATIVE mg/dL
Spec Grav, UA: 1.025 (ref 1.010–1.025)
Urobilinogen, UA: 0.2 E.U./dL
pH, UA: 6 (ref 5.0–8.0)

## 2022-03-04 LAB — POCT UA - MICROSCOPIC ONLY: RBC, Urine, Miroscopic: NONE SEEN (ref 0–2)

## 2022-03-04 LAB — POCT URINE PREGNANCY: Preg Test, Ur: NEGATIVE

## 2022-03-04 MED ORDER — NITROFURANTOIN MONOHYD MACRO 100 MG PO CAPS: 100.0000 mg | ORAL_CAPSULE | Freq: Two times a day (BID) | ORAL | 0 refills | Status: AC

## 2022-03-04 NOTE — Assessment & Plan Note (Addendum)
Patient endorses clumpy white discharge for a week.  Denies itching.  No new partners.  Physical exam with some mild white discharge with several small hairs. Wet prep unremarkable.  Upreg negative. STI check pending.  Declines blood draw.  Not on contraception, states she does not like how it makes her feel.  Does not desire pregnancy.  Declines starting something today.  Advised of Plan B.

## 2022-03-04 NOTE — Assessment & Plan Note (Signed)
Dysuria and abdominal pain 1 Week in duration, similar to last UTI 5 months ago. Supra pubic tenderness on exam. UA unremarkable, will check urine culture.  5 months ago UA was also unremarkable but grew Proteus. Treating with Macrobid '100mg'$  BID for 5 days

## 2022-03-04 NOTE — Patient Instructions (Addendum)
It was great seeing you today!  Today we checked your urine for infection, check for BV, yeast, and STIs.  Your urine did show infection so we are treating with Macrobid twice a day for 5 days.  You can pick up Plan B for pregnancy prevention over the counter if needed   Visit Reminders: - Stop by the pharmacy to pick up your prescriptions  - Continue to work on your healthy eating habits and incorporating exercise into your daily life.   Feel free to call with any questions or concerns at any time, at [phone].   Take care,  Dr. Shary Key [facility]

## 2022-03-04 NOTE — Progress Notes (Cosign Needed)
    SUBJECTIVE:   CHIEF COMPLAINT / HPI:   Patient presents for concern for UTI or yeast infection. Presents with her partner.   Endorses burning when urinates and lower abdominal pain for the past week. Had UTI cultures grew proteus 5 months ago. Feels similar to her infection at that time. No known exposure. Does have thick white discharge as well. Denies vaginal itching. Endorses some pain with intercourse and states that is not new. Not on birth control was on it before and didn't like how it made her feel Declines today.    PERTINENT  PMH / PSH: Reviewed   OBJECTIVE:   BP 128/76   Pulse 67   Wt 206 lb (93.4 kg)   SpO2 100%   BMI 34.28 kg/m    Physical exam General: well appearing, NAD Cardiovascular: RRR, no murmurs Lungs: CTAB. Normal WOB Abdomen: soft, non-distended, mild suprapubic tenderness  Skin: warm, dry. No edema Pelvic exam: normal external genitalia, vulva, vagina, cervix, uterus and adnexa. Minimal discharge present though did visualize some small hairs   ASSESSMENT/PLAN:   Dysuria Dysuria and abdominal pain 1 Week in duration, similar to last UTI 5 months ago. Supra pubic tenderness on exam. UA unremarkable, will check urine culture.  5 months ago UA was also unremarkable but grew Proteus. Treating with Macrobid '100mg'$  BID for 5 days  Vaginal discharge Patient endorses clumpy white discharge for a week.  Denies itching.  No new partners.  Physical exam with some mild white discharge with several small hairs. Wet prep unremarkable.  Upreg negative. STI check pending.  Declines blood draw.  Not on contraception, states she does not like how it makes her feel.  Does not desire pregnancy.  Declines starting something today.  Advised of Plan B.   [place]

## 2022-03-05 LAB — CERVICOVAGINAL ANCILLARY ONLY
Bacterial Vaginitis (gardnerella): NEGATIVE
Candida Glabrata: NEGATIVE
Candida Vaginitis: POSITIVE — AB
Chlamydia: NEGATIVE
Comment: NEGATIVE
Comment: NEGATIVE
Comment: NEGATIVE
Comment: NEGATIVE
Comment: NEGATIVE
Comment: NORMAL
Neisseria Gonorrhea: NEGATIVE
Trichomonas: NEGATIVE

## 2022-03-06 LAB — URINE CULTURE

## 2022-03-12 ENCOUNTER — Other Ambulatory Visit: Payer: Self-pay | Admitting: Family Medicine

## 2022-03-12 MED ORDER — FLUCONAZOLE 150 MG PO TABS
150.0000 mg | ORAL_TABLET | ORAL | 0 refills | Status: DC
Start: 2022-03-12 — End: 2022-07-09

## 2022-04-16 ENCOUNTER — Ambulatory Visit: Payer: Medicaid Other | Admitting: Podiatry

## 2022-04-30 ENCOUNTER — Other Ambulatory Visit: Payer: Self-pay | Admitting: Family Medicine

## 2022-05-14 ENCOUNTER — Ambulatory Visit (INDEPENDENT_AMBULATORY_CARE_PROVIDER_SITE_OTHER): Payer: Medicaid Other | Admitting: Family Medicine

## 2022-05-14 ENCOUNTER — Other Ambulatory Visit: Payer: Self-pay

## 2022-05-14 ENCOUNTER — Other Ambulatory Visit (HOSPITAL_COMMUNITY)
Admission: RE | Admit: 2022-05-14 | Discharge: 2022-05-14 | Disposition: A | Discharge status: Home / Self Care | Payer: Medicaid Other | Source: Ambulatory Visit | Attending: Family Medicine | Admitting: Family Medicine

## 2022-05-14 ENCOUNTER — Encounter: Payer: Self-pay | Admitting: Family Medicine

## 2022-05-14 VITALS — BP 138/72 | HR 93 | Wt 201.4 lb

## 2022-05-14 DIAGNOSIS — R300 Dysuria: Secondary | ICD-10-CM

## 2022-05-14 DIAGNOSIS — N898 Other specified noninflammatory disorders of vagina: Secondary | ICD-10-CM | POA: Insufficient documentation

## 2022-05-14 LAB — POCT WET PREP (WET MOUNT)
Clue Cells Wet Prep Whiff POC: NEGATIVE
Trichomonas Wet Prep HPF POC: ABSENT

## 2022-05-14 LAB — POCT URINALYSIS DIP (MANUAL ENTRY)
Bilirubin, UA: NEGATIVE
Blood, UA: NEGATIVE
Glucose, UA: NEGATIVE mg/dL
Ketones, POC UA: NEGATIVE mg/dL
Nitrite, UA: NEGATIVE
Protein Ur, POC: NEGATIVE mg/dL
Spec Grav, UA: 1.01 (ref 1.010–1.025)
Urobilinogen, UA: 0.2 E.U./dL
pH, UA: 7 (ref 5.0–8.0)

## 2022-05-14 LAB — POCT UA - MICROSCOPIC ONLY: RBC, Urine, Miroscopic: NONE SEEN (ref 0–2)

## 2022-05-14 LAB — POCT URINE PREGNANCY: Preg Test, Ur: NEGATIVE

## 2022-05-14 MED ORDER — METRONIDAZOLE 500 MG PO TABS: 500.0000 mg | ORAL_TABLET | Freq: Three times a day (TID) | ORAL | 0 refills | Status: DC

## 2022-05-14 MED ORDER — NITROFURANTOIN MONOHYD MACRO 100 MG PO CAPS: 100.0000 mg | ORAL_CAPSULE | Freq: Two times a day (BID) | ORAL | 0 refills | Status: AC

## 2022-05-14 NOTE — Assessment & Plan Note (Signed)
Pt with ongoing intermittent dysuria. Concern for recurrent UTIs. Previous infxn with Proteus. Most likely secondary to post coital infection vs if proteus reinfection pt could have kidney stones. UA not indicative of definite UTI.  - F/u Urine culture, order antibiotics based on culture data  - educate patient regarding post coital urination, wiping front to back  - F/u in 3 weeks to discuss recurrent UTI prevention

## 2022-05-14 NOTE — Progress Notes (Signed)
    SUBJECTIVE:   CHIEF COMPLAINT / HPI:   Dysuria  Started a month ago. Comes and goes in intensity. Past 2 weeks burning has been sp bad pt has had to put ice near her genitals. She feels like the area is hot. She feels like her body temperature has also been hot. She reports cold sweats. She reports this daily. Has tried monostat 7 and has been drinking lots of water, cutting back on sugar, eating healthier. She has been having suprapubic pain but also mentions that she has constant constipation.  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 Also endorses urinary frequency and urgency.   Vaginal discharge  Discharge also started a month ago. Milky white discharge clumpy almost cottage cheese like. Noticed an odor. Say she feels like her herpes medication increases her discharge.  Has been using aloe vera soap on her external vaginal area.  Has been using baby wash cloths.  Periods have been heavier as she's had BV more often. They have been regular.  Last sexual intercourse 3 days ago. Does not use condoms.   PERTINENT  PMH / PSH: Hx of BV, Proteus UTI 7 months ago   OBJECTIVE:   BP 138/72   Pulse 93   Wt 201 lb 6.4 oz (91.4 kg)   SpO2 99%   BMI 33.51 kg/m   General: Well appearing young woman  CV: well perfused, palpable equal pulses  Resp: Normal work of breathing on room air  Abd: suprapubic pain, soft, non distended  GU: Cervix non friable, thick opaque white-yellow-green discharge, no bleeding, no odor   ASSESSMENT/PLAN:   Dysuria Pt with ongoing intermittent dysuria. Concern for recurrent UTIs. Previous infxn with Proteus. Most likely secondary to post coital infection vs if proteus reinfection pt could have kidney stones. UA not indicative of definite UTI.  - F/u Urine culture, order antibiotics based on culture data  - educate patient regarding post coital urination, wiping front to back  - F/u in  3 weeks to discuss recurrent UTI prevention   Vaginal discharge Pt with recurrent BV. Discharge concerning for BV again given discharge on speculum exam. STI less likely, cervix not friable, no other signs on exam.  - F/u swab, prescribe abx such as metronidazole if positive  - GC/CT testing  - Educate regarding use of condoms to help prevent BV, avoiding using soaps near vaginal area, normal odor and discharge  - F/u in 3 weeks to discuss medication to prevent recurrent BV (boric acid, clindamycin ointment)      Lowry Ram, MD [place]

## 2022-05-14 NOTE — Patient Instructions (Signed)
It was great to see you today! Thank you for choosing [facility] for your primary care. Deanna Mckay was seen for burning with urination and vaginal discharge.  Today we addressed: Recurrent UTI - Once I get the results of your urine studies back I will prescribe you antibiotics. Please urinate after having sexual intercourse and wipe from front to back. Avoid using scented soaps or douches.  BV - I will prescribe you antibiotics as soon as I get the sample back. Avoid using soaps in your vaginal area. Use condoms to prevent substances in the ejaculate from causing BV.   If you haven't already, sign up for My Chart to have easy access to your labs results, and communication with your primary care physician.  We are checking some labs today. If they are abnormal, I will call you. If they are normal, I will send you a MyChart message (if it is active) or a letter in the mail. If you do not hear about your labs in the next 2 weeks, please call the office.   You should return to our clinic Return in 22 days (on 06/05/2022).  I recommend that you always bring your medications to each appointment as this makes it easy to ensure you are on the correct medications and helps [place] not miss refills when you need them.  Please arrive 15 minutes before your appointment to ensure smooth check in process.  We appreciate your efforts in making this happen.  Please call the clinic at [phone] if your symptoms worsen or you have any concerns.  Thank you for allowing me to participate in your care, Lowry Ram, MD 05/14/2022, 2:23 PM PGY-1, [place]

## 2022-05-14 NOTE — Addendum Note (Signed)
Addended by: Lowry Ram on: 05/14/2022 02:57 PM   Modules accepted: Orders

## 2022-05-14 NOTE — Assessment & Plan Note (Signed)
Pt with recurrent BV. Discharge concerning for BV again given discharge on speculum exam. STI less likely, cervix not friable, no other signs on exam.  - F/u swab, prescribe abx such as metronidazole if positive  - GC/CT testing  - Educate regarding use of condoms to help prevent BV, avoiding using soaps near vaginal area, normal odor and discharge  - F/u in 3 weeks to discuss medication to prevent recurrent BV (boric acid, clindamycin ointment)

## 2022-05-15 LAB — CERVICOVAGINAL ANCILLARY ONLY
Chlamydia: NEGATIVE
Comment: NEGATIVE
Comment: NEGATIVE
Comment: NORMAL
Neisseria Gonorrhea: NEGATIVE
Trichomonas: NEGATIVE

## 2022-06-05 ENCOUNTER — Ambulatory Visit (INDEPENDENT_AMBULATORY_CARE_PROVIDER_SITE_OTHER): Payer: Medicaid Other | Admitting: Family Medicine

## 2022-06-05 ENCOUNTER — Encounter: Payer: Self-pay | Admitting: Family Medicine

## 2022-06-05 VITALS — BP 114/78 | HR 67 | Ht 65.0 in | Wt 203.2 lb

## 2022-06-05 DIAGNOSIS — N942 Vaginismus: Secondary | ICD-10-CM

## 2022-06-05 DIAGNOSIS — R300 Dysuria: Secondary | ICD-10-CM

## 2022-06-05 DIAGNOSIS — R208 Other disturbances of skin sensation: Secondary | ICD-10-CM

## 2022-06-05 HISTORY — DX: Vaginismus: N94.2

## 2022-06-05 LAB — POCT URINALYSIS DIP (MANUAL ENTRY)
Bilirubin, UA: NEGATIVE
Blood, UA: NEGATIVE
Glucose, UA: NEGATIVE mg/dL
Ketones, POC UA: NEGATIVE mg/dL
Nitrite, UA: NEGATIVE
Protein Ur, POC: NEGATIVE mg/dL
Spec Grav, UA: 1.02 (ref 1.010–1.025)
Urobilinogen, UA: 0.2 E.U./dL
pH, UA: 7 (ref 5.0–8.0)
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
## 2022-06-05 NOTE — Assessment & Plan Note (Signed)
Still endorses dysuria; however, says irritation even while not urinating. Lab hx does not endorse recurrent UTI. More likely interstitial cystitis.  - UA with reflex UCx  - education and counseling regarding lifestyle changes for interstitial cystitis  - Will prescribe pyridium if UA is unremarkable.  - Recommended to keep a Bladder diary

## 2022-06-05 NOTE — Addendum Note (Signed)
Addended by: Lowry Ram on: 06/05/2022 03:09 PM   Modules accepted: Orders

## 2022-06-05 NOTE — Patient Instructions (Signed)
I will reach out to you regarding your urine studies. I will probably prescribe a medication to decrease inflammation and nerve pain if it is negative. In the meantime these are some recommendations you can follow.   Application of local heat or cold over the bladder or perineum.  ?Avoidance of activities or food or beverages that exacerbate symptoms (eg, caffeine, alcohol, artificial sweeteners, hot pepper, and vitamin C-containing foods) These foods may be avoided until symptoms resolve, at which time they may be reintroduced. The Interstitial Cystitis Association provides online diet advice for patients   ?Avoidance of exercises, recreational activities, sexual activities, or body positions that seem to worsen the bladder symptoms. A symptom diary may be useful for some patients to self-identify such factors.  ?Fluid management - A fluid and voiding diary can provide helpful information. Please continue to drink lots of water if that is helping your symptoms. In most patients, it is not necessary to drink more than 2 L of fluid per day.   I will also put in a referral for pelvic therapy.   Here are a list of mental health therapists you can reach out to.    Therapy and Counseling Resources Most providers on this list will take Medicaid. Patients with commercial insurance or Medicare should contact their insurance company to get a list of in network providers.  [employer] [employer] (takes children) Location 1: [address], [place], [place] [address] Location 2: [place], [place] [address] [place] ([place] speaking therapist available)(habla espanol)(take medicare and medicaid)  [place], [place], [place] [address], [place] [email or web address] [phone]  [facility] Counseling [address] [place], [place] [address] [place], [place], [place] [address]       [phone]  [place] (spanish available) [place],  [address] [place] (take [facility] and medicare) [address], [place],  [address]       [phone]     [place] (virtual only) [phone]  [facility] [address] [place], [place]  [facility]:  [place]. [place] [phone]  [facility]:  [place] [address] [place] [phone]  [facility] (under & uninsured) [address] [place] [phone]    [email or web address]    [place] [address]  [place]    [phone]  [facility] [place]     Phone:  [phone]     [place]  [place] [address]      [place], [place] ext [place]: [place], [place], [place]   [place] ([place] therapist) [email or web address]  [place] [address]   [place] [address]    [phone]    [facility]   [address],  [place], [place]   [place]  [place]  [facility]  [address] [place], [place]        [phone]  Open Access/Walk In Clinic under & uninsured  [facility]  [address] [place], [place]  [address] [phone] Crisis [phone]  [facility] [place],  ([place])   [place], [place]: [phone]) 8:30 - 12; 1 - 2:30  [facility] the [place],  [place], [place]    [phone]):8:30 - 12; 2 - 3PM  [facility] [employer],  [address]  [place]; [phone]):   Mon -  Fri 8 AM - 5 PM  [facility] [place]  MWF 12:30 to 3:00 or call to schedule an appointment  [phone]  Specific Provider options Psychology Today  [email or web address] click on find a therapist  enter your zip code left side and select or tailor a therapist for your specific need.   [facility] Provider Directory [email or web address]  (Medicaid)   Follow all drop down to find a provider  [place] or [email or web address] [address], [place], [place] Recovery support and educational   24- Hour Availability:   [facility] [place]  [address] [place], [place] Crisis [phone]  [facility] the [employer] [phone]  [place]  [phone]   [place]  [phone] (after hours)  Therapeutic Alternative/Mobile Crisis   [phone]  [place] National Suicide Hotline  [phone] Diamantina Monks)  Call 911 or go to emergency room  [facility]  [phone]);  [place] and [employer]  [phone]); [place], [place], [place], [place], [place], [place], [place]
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
## 2022-06-08 LAB — URINE CULTURE

## 2022-06-08 MED ORDER — PHENAZOPYRIDINE HCL 200 MG PO TABS: 200.0000 mg | ORAL_TABLET | Freq: Three times a day (TID) | ORAL | 0 refills | Status: DC | PRN

## 2022-06-08 NOTE — Addendum Note (Signed)
Addended by: Lowry Ram on: 06/08/2022 02:15 PM   Modules accepted: Orders

## 2022-07-09 ENCOUNTER — Encounter: Payer: Self-pay | Admitting: Family Medicine

## 2022-07-09 ENCOUNTER — Other Ambulatory Visit (HOSPITAL_COMMUNITY)
Admission: RE | Admit: 2022-07-09 | Discharge: 2022-07-09 | Disposition: A | Discharge status: Home / Self Care | Payer: Medicaid Other | Source: Ambulatory Visit | Attending: Family Medicine | Admitting: Family Medicine

## 2022-07-09 ENCOUNTER — Ambulatory Visit (INDEPENDENT_AMBULATORY_CARE_PROVIDER_SITE_OTHER): Payer: Medicaid Other | Admitting: Family Medicine

## 2022-07-09 VITALS — BP 121/80 | HR 80 | Temp 98.1°F | Ht 64.0 in | Wt 199.6 lb

## 2022-07-09 DIAGNOSIS — R300 Dysuria: Secondary | ICD-10-CM | POA: Diagnosis not present

## 2022-07-09 DIAGNOSIS — N898 Other specified noninflammatory disorders of vagina: Secondary | ICD-10-CM | POA: Insufficient documentation

## 2022-07-09 DIAGNOSIS — N941 Unspecified dyspareunia: Secondary | ICD-10-CM

## 2022-07-09 LAB — POCT WET PREP (WET MOUNT)
Clue Cells Wet Prep Whiff POC: NEGATIVE
Trichomonas Wet Prep HPF POC: ABSENT

## 2022-07-09 NOTE — Patient Instructions (Addendum)
It was great to see you again today!  Testing for infections today  Call urologist office about scheduling with pelvic floor physical therapy  [facility]  [phone] [address], [place] [address]  Be well, Dr. Ardelia Mems
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
## 2022-07-10 DIAGNOSIS — N941 Unspecified dyspareunia: Secondary | ICD-10-CM

## 2022-07-10 HISTORY — DX: Unspecified dyspareunia: N94.10

## 2022-07-10 LAB — CERVICOVAGINAL ANCILLARY ONLY
Candida Glabrata: NEGATIVE
Candida Vaginitis: NEGATIVE
Chlamydia: NEGATIVE
Comment: NEGATIVE
Comment: NEGATIVE
Comment: NEGATIVE
Comment: NEGATIVE
Comment: NORMAL
Neisseria Gonorrhea: NEGATIVE
Trichomonas: NEGATIVE
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
## 2022-07-10 NOTE — Assessment & Plan Note (Deleted)
Given that anterior vaginal wall location of the patient's pain as well as her mild urinary frequency, the most likely etiology of her dysuria is a weakening of the pelvic floor.  The patient will benefit from pelvic floor strengthening exercises.  If symptoms continue other etiologies can be considered.  Given the patient's past psychiatric history, differential also includes psychosomatic etiology of pain, but it is important to rule out other possible causes prior to pursuing this option. - Provided patient with phone number to follow-up with [facility], where she previously received a referral for pelvic floor therapy

## 2022-07-10 NOTE — Assessment & Plan Note (Addendum)
Patient's persistent symptoms along with physical exam findings of thick, clumpy vaginal discharge concerning for possible STI or vaginal flora infection.  POC wet prep did not show any clue cells and was wholly unremarkable.  Will await further lab results prior to diagnosis. - Collected cervical vaginal swab, f/u results and prescribe appropriate antibiotics if results positive

## 2022-07-11 NOTE — Assessment & Plan Note (Signed)
Ongoing dysuria in setting of dyspareunia and chronic vaginal discharge. Elected not to repeat urine testing today as symptoms have unchanged since last visit and urine without UTI at last visit. This may be interstitial cystitis/chronic bladder pain syndrome, especially in light of exam findings today.  - trial of pelvic floor physical therapy

## 2022-08-24 ENCOUNTER — Encounter (HOSPITAL_BASED_OUTPATIENT_CLINIC_OR_DEPARTMENT_OTHER): Payer: Self-pay | Admitting: Emergency Medicine

## 2022-08-24 ENCOUNTER — Other Ambulatory Visit: Payer: Self-pay

## 2022-08-24 ENCOUNTER — Emergency Department (HOSPITAL_BASED_OUTPATIENT_CLINIC_OR_DEPARTMENT_OTHER)
Admission: EM | Admit: 2022-08-24 | Discharge: 2022-08-24 | Disposition: A | Discharge status: Home / Self Care | Payer: Medicaid Other | Attending: Emergency Medicine | Admitting: Emergency Medicine

## 2022-08-24 DIAGNOSIS — Z7984 Long term (current) use of oral hypoglycemic drugs: Secondary | ICD-10-CM | POA: Insufficient documentation

## 2022-08-24 DIAGNOSIS — H538 Other visual disturbances: Secondary | ICD-10-CM | POA: Insufficient documentation

## 2022-08-24 DIAGNOSIS — H53143 Visual discomfort, bilateral: Secondary | ICD-10-CM | POA: Insufficient documentation

## 2022-08-24 MED ORDER — TETRACAINE HCL 0.5 % OP SOLN
2.0000 [drp] | Freq: Once | OPHTHALMIC | Status: AC
  Administered 2022-08-24: 2 [drp] via OPHTHALMIC
  Filled 2022-08-24: qty 4

## 2022-08-24 MED ORDER — FLUORESCEIN SODIUM 1 MG OP STRP
1.0000 | ORAL_STRIP | Freq: Once | OPHTHALMIC | Status: AC
  Administered 2022-08-24: 1 via OPHTHALMIC
  Filled 2022-08-24: qty 1

## 2022-08-24 NOTE — ED Provider Notes (Signed)
[facility] EMERGENCY DEPT Provider Note   CSN: [id number] Arrival date & time: 08/24/22  1358     History  Chief Complaint  Patient presents with   Eye Problem    Deanna Mckay is a 21 y.o. female.   Eye Problem    Patient with medical history OPP, allergies, GERD, anxiety presents to the emergency department due to blurry vision.  Patient tells me that the blurry vision started today, is associated with bilateral photophobia.  She normally wears glasses but has been wearing them for the last few weeks.  Denies any drainage, itching, redness.  Feels like her eyes are "fluttering".   Home Medications Prior to Admission medications   Medication Sig Start Date End Date Taking? Authorizing Provider  albuterol (PROVENTIL HFA;VENTOLIN HFA) 108 (90 Base) MCG/ACT inhaler Inhale 2 puffs into the lungs every 4 (four) hours as needed for wheezing or shortness of breath. 11/04/18   Sherene Sires, DO  ARIPiprazole (ABILIFY) 20 MG tablet Take 20 mg by mouth daily.    [provider]  Cholecalciferol (VITAMIN D3) 50 MCG (2000 UT) capsule Take 1 capsule (2,000 Units total) by mouth daily. 02/24/19   Richarda Osmond, MD  EPINEPHrine (EPIPEN 2-PAK) 0.3 mg/0.3 mL IJ SOAJ injection Inject 0.3 mLs (0.3 mg total) into the muscle as needed for anaphylaxis (if you use this, you must call 911 immediately). 03/27/19   Griffin Basil, NP  FLUoxetine (PROZAC) 40 MG capsule Take 40 mg by mouth at bedtime. 03/28/22   [provider]  fluticasone (FLOVENT HFA) 44 MCG/ACT inhaler Inhale 2 puffs into the lungs in the morning and at bedtime. 05/16/20   [name], MD  hydrocortisone cream 1 % Apply 1 application topically 2 (two) times daily as needed (eczema). 07/18/19   Nuala Alpha, MD  hydrOXYzine (ATARAX) 25 MG tablet Take 12.5 mg by mouth at bedtime as needed. 03/28/22   [provider]  valACYclovir (VALTREX) 500 MG tablet Take 1 tablet by mouth once  daily 04/30/22   [name], MD  metFORMIN (GLUCOPHAGE) 500 MG tablet Take 1 tablet (500 mg total) by mouth 2 (two) times daily with a meal. 05/07/11 12/04/11  Sherrlyn Hock, MD      Allergies    Other, Trichophyton, Midazolam hcl, Penicillins, Soy allergy, Versed [midazolam hcl], Ranitidine hcl, and Zantac [ranitidine hcl]    Review of Systems   Review of Systems  Physical Exam Updated Vital Signs BP (!) 138/102 (BP Location: Right Arm)   Pulse 67   Temp 97.8 F (36.6 C)   Resp 16   SpO2 100%  Physical Exam Vitals and nursing note reviewed. Exam conducted with a chaperone present.  Constitutional:      General: She is not in acute distress.    Appearance: Normal appearance.  HENT:     Head: Normocephalic and atraumatic.  Eyes:     General: No scleral icterus.    Pupils: Pupils are equal, round, and reactive to light.     Comments: EOMI, no nystagmus.  Optic disc is visualized on exam, pupils are reactive bilaterally.  No fluorescein uptake, IOP 16 bilaterally.  Skin:    Coloration: Skin is not jaundiced.  Neurological:     Mental Status: She is alert. Mental status is at baseline.     Coordination: Coordination normal.     Comments: Cranial nerves II through XII are grossly intact.     ED Results / Procedures / Treatments  Labs (all labs ordered are listed, but only abnormal results are displayed) Labs Reviewed - No data to display  EKG None  Radiology No results found.  Procedures Procedures    Medications Ordered in ED Medications  tetracaine (PONTOCAINE) 0.5 % ophthalmic solution 2 drop (2 drops Both Eyes Given 08/24/22 1444)  fluorescein ophthalmic strip 1 strip (1 strip Both Eyes Given 08/24/22 1444)    ED Course/ Medical Decision Making/ A&P                           Medical Decision Making Risk Prescription drug management.   Patient presents due to bilateral blurry vision and photophobia.  On exam she has normal EOMs, there is  no nystagmus.  Cranial nerves II through XII are grossly intact, there is no fluorescein uptake and IOP 16 bilaterally without increased intraocular pressure.  Her visual acuity is roughly baseline as I would expect given she is not wearing her glasses.  I suspect her blurry vision is secondary to not wearing her corrective lenses, I have encouraged her to wear these and follow-up with an ophthalmologist.        Final Clinical Impression(s) / ED Diagnoses Final diagnoses:  Blurry vision, bilateral    Rx / DC Orders ED Discharge Orders     None         Sherrill Raring, PA-C 08/24/22 [place], DO 08/25/22 0701

## 2022-08-24 NOTE — ED Triage Notes (Signed)
Pt was at work and suddenly her eyes became sensitive to light, some blurred vision.

## 2022-08-24 NOTE — Discharge Instructions (Signed)
Please wear your glasses throughout the day.  This should probably help with the blurry vision.  Call your ophthalmologist and schedule follow-up for the next 48 hours.  Return to the ED for new or concerning symptoms or if things change or worsen.

## 2022-08-24 NOTE — ED Triage Notes (Signed)
For the past 2 weeks , eyes have been blurry.

## 2022-08-24 NOTE — ED Notes (Signed)
Discharge paperwork given and verbally understood. 

## 2022-12-03 ENCOUNTER — Other Ambulatory Visit: Payer: Self-pay

## 2022-12-03 ENCOUNTER — Encounter (HOSPITAL_BASED_OUTPATIENT_CLINIC_OR_DEPARTMENT_OTHER): Payer: Self-pay

## 2022-12-03 ENCOUNTER — Emergency Department (HOSPITAL_BASED_OUTPATIENT_CLINIC_OR_DEPARTMENT_OTHER)
Admission: EM | Admit: 2022-12-03 | Discharge: 2022-12-04 | Disposition: A | Discharge status: Home / Self Care | Payer: Medicaid Other | Attending: Emergency Medicine | Admitting: Emergency Medicine

## 2022-12-03 DIAGNOSIS — R1110 Vomiting, unspecified: Secondary | ICD-10-CM | POA: Insufficient documentation

## 2022-12-03 DIAGNOSIS — J45909 Unspecified asthma, uncomplicated: Secondary | ICD-10-CM | POA: Insufficient documentation

## 2022-12-03 DIAGNOSIS — R197 Diarrhea, unspecified: Secondary | ICD-10-CM | POA: Diagnosis present

## 2022-12-03 DIAGNOSIS — R140 Abdominal distension (gaseous): Secondary | ICD-10-CM | POA: Diagnosis not present

## 2022-12-03 DIAGNOSIS — Z20822 Contact with and (suspected) exposure to covid-19: Secondary | ICD-10-CM | POA: Diagnosis not present

## 2022-12-03 LAB — URINALYSIS, ROUTINE W REFLEX MICROSCOPIC
Bilirubin Urine: NEGATIVE
Glucose, UA: NEGATIVE mg/dL
Ketones, ur: NEGATIVE mg/dL
Leukocytes,Ua: NEGATIVE
Nitrite: NEGATIVE
Specific Gravity, Urine: 1.026 (ref 1.005–1.030)
pH: 6 (ref 5.0–8.0)

## 2022-12-03 LAB — COMPREHENSIVE METABOLIC PANEL
ALT: 11 U/L (ref 0–44)
AST: 15 U/L (ref 15–41)
Albumin: 4 g/dL (ref 3.5–5.0)
Alkaline Phosphatase: 51 U/L (ref 38–126)
Anion gap: 4 — ABNORMAL LOW (ref 5–15)
BUN: 10 mg/dL (ref 6–20)
CO2: 31 mmol/L (ref 22–32)
Calcium: 9.2 mg/dL (ref 8.9–10.3)
Chloride: 106 mmol/L (ref 98–111)
Creatinine, Ser: 1.01 mg/dL — ABNORMAL HIGH (ref 0.44–1.00)
GFR, Estimated: >60 mL/min (ref >60)
Glucose, Bld: 84 mg/dL (ref 70–99)
Potassium: 4.1 mmol/L (ref 3.5–5.1)
Sodium: 141 mmol/L (ref 135–145)
Total Bilirubin: 0.3 mg/dL (ref 0.3–1.2)
Total Protein: 7.7 g/dL (ref 6.5–8.1)

## 2022-12-03 LAB — CBC
HCT: 36.3 % (ref 36.0–46.0)
Hemoglobin: 11.8 g/dL — ABNORMAL LOW (ref 12.0–15.0)
MCH: 30.4 pg (ref 26.0–34.0)
MCHC: 32.5 g/dL (ref 30.0–36.0)
MCV: 93.6 fL (ref 80.0–100.0)
Platelets: 296 10*3/uL (ref 150–400)
RBC: 3.88 MIL/uL (ref 3.87–5.11)
RDW: 13.8 % (ref 11.5–15.5)
WBC: 4.4 10*3/uL (ref 4.0–10.5)
nRBC: 0 % (ref 0.0–0.2)

## 2022-12-03 LAB — LIPASE, BLOOD: Lipase: 33 U/L (ref 11–51)

## 2022-12-03 LAB — PREGNANCY, URINE: Preg Test, Ur: NEGATIVE

## 2022-12-03 NOTE — ED Triage Notes (Signed)
Patient here POV from Home.  Endorses N/V/D that began Sunday. Contact with individuals with Similar Symptoms.   No Aches. No Known Fever. No ABD Pain. No Cough.   NAD Noted during Triage. A&Ox4. GCS 15. Ambulatory.

## 2022-12-04 LAB — RESP PANEL BY RT-PCR (RSV, FLU A&B, COVID)  RVPGX2
Influenza A by PCR: NEGATIVE
Influenza B by PCR: NEGATIVE
Resp Syncytial Virus by PCR: NEGATIVE
SARS Coronavirus 2 by RT PCR: NEGATIVE

## 2022-12-04 MED ORDER — ONDANSETRON 4 MG PO TBDP: 4.0000 mg | ORAL_TABLET | Freq: Three times a day (TID) | ORAL | 0 refills | Status: DC | PRN

## 2022-12-04 MED ORDER — LOPERAMIDE HCL 2 MG PO CAPS: 2.0000 mg | ORAL_CAPSULE | Freq: Four times a day (QID) | ORAL | 0 refills | Status: DC | PRN

## 2022-12-04 NOTE — Discharge Instructions (Signed)
You were evaluated in the Emergency Department and after careful evaluation, we did not find any emergent condition requiring admission or further testing in the hospital.  Your exam/testing today is overall reassuring.  Symptoms likely due to a viral illness.  Use the Zofran as needed for nausea, use the Imodium as needed for diarrhea.  Plenty of fluids, slowly advance your diet.  Please return to the Emergency Department if you experience any worsening of your condition.   Thank you for allowing [place] to be a part of your care.

## 2022-12-04 NOTE — ED Provider Notes (Signed)
[facility] Emergency Department Provider Note MRN:  [id number]  Arrival date & time: 12/04/22     Chief Complaint   Diarrhea   History of Present Illness   Deanna Mckay is a 22 y.o. year-old female with no pertinent past medical history presenting to the ED with chief complaint of diarrhea.  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  Took care of that patient and then the next day began having abdominal bloating followed by diffuse watery diarrhea.  A few episodes of vomiting as well.  No significant abdominal pain.  Significant other also began having the same symptoms.  Review of Systems  A thorough review of systems was obtained and all systems are negative except as noted in the HPI and PMH.   Patient's Health History    Past Medical History:  Diagnosis Date   Acid reflux    Allergy    Anxiety    Asthma    prn inhaler   Bipolar and related disorder ([place]) 12/17/2015   Depression    Dyspepsia    no current med.   Eczema    Herpes 2018   Isosexual precocity    Obesity    Oppositional defiant disorder    Post traumatic stress disorder    Post-operative nausea and vomiting    Seasonal allergies     Past Surgical History:  Procedure Laterality Date   CLOSED REDUCTION AND PERCUTANEOUS PINNING OF HUMERUS FRACTURE Right 10/31/2005   supracondylar humerus fx.   CYST EXCISION Right 07/11/2002   temple area   [place] Left 01/11/2014   Procedure: REMOVAL OF SUPPRELIN IMPLANT IN LEFT UPPER EXTREMITY;  Surgeon: Jerilynn Mages. Gerald Stabs, MD;  Location: [place];  Service: Pediatrics;  Laterality: Left;   MOUTH SURGERY     SUPPRELIN IMPLANT  01/14/2012   Procedure: SUPPRELIN IMPLANT;  Surgeon: Jerilynn Mages. Gerald Stabs, MD;  Location: [place];  Service: Pediatrics;  Laterality: Left;   TOENAIL EXCISION Right 03/19/2008   great toe    Family History  Problem Relation Age of Onset   Stroke  Mother    Asthma Mother    Depression Mother    Hypertension Father    Heart disease Father    Asthma Father    Eczema Father     Social History   Socioeconomic History   Marital status: Single    Spouse name: Not on file   Number of children: Not on file   Years of education: Not on file   Highest education level: Not on file  Occupational History   Occupation: minor    Employer: [employer]    Comment: 4th grade at [employer] Use   Smoking status: Never   Smokeless tobacco: Never  Substance and Sexual Activity   Alcohol use: No   Drug use: Not Currently    Types: Marijuana   Sexual activity: Not Currently    Birth control/protection: None  Other Topics Concern   Not on file  Social History Narrative   Pt lived at home with mother.   Social Determinants of Health   Financial Resource Strain: Not on file  Food Insecurity: Not on file  Transportation Needs: Not on file  Physical Activity: Not on file  Stress: Not on file  Social Connections: Not on file  Intimate Partner Violence: Not on file     Physical Exam   Vitals:   12/03/22 2246  BP: 139/71  Pulse: 65  Resp: 15  Temp: 97.7 F (36.5 C)  SpO2: 99%    CONSTITUTIONAL: Well-appearing, NAD NEURO/PSYCH:  Alert and oriented x 3, no focal deficits EYES:  eyes equal and reactive ENT/NECK:  no LAD, no JVD CARDIO: Regular rate, well-perfused, normal S1 and S2 PULM:  CTAB no wheezing or rhonchi GI/GU:  non-distended, non-tender MSK/SPINE:  No gross deformities, no edema SKIN:  no rash, atraumatic   *Additional and/or pertinent findings included in MDM below  Diagnostic and Interventional Summary    EKG Interpretation  Date/Time:    Ventricular Rate:    PR Interval:    QRS Duration:   QT Interval:    QTC Calculation:   R Axis:     Text Interpretation:         Labs Reviewed  COMPREHENSIVE METABOLIC PANEL - Abnormal; Notable for the following components:      Result Value    Creatinine, Ser 1.01 (*)    Anion gap 4 (*)    All other components within normal limits  CBC - Abnormal; Notable for the following components:   Hemoglobin 11.8 (*)    All other components within normal limits  URINALYSIS, ROUTINE W REFLEX MICROSCOPIC - Abnormal; Notable for the following components:   Hgb urine dipstick SMALL (*)    Protein, ur TRACE (*)    Bacteria, UA FEW (*)    All other components within normal limits  RESP PANEL BY RT-PCR (RSV, FLU A&B, COVID)  RVPGX2  LIPASE, BLOOD  PREGNANCY, URINE    No orders to display    Medications - No data to display   Procedures  /  Critical Care Procedures  ED Course and Medical Decision Making  Initial Impression and Ddx Suspect viral gastroenteritis with nausea vomiting and diarrhea.  Symptoms for 5 days, improving over the past 24 hours.  Abdomen soft nontender, vital signs normal, no fever.  Past medical/surgical history that increases complexity of ED encounter: None  Interpretation of Diagnostics I personally reviewed the laboratory assessment and my interpretation is as follows: No significant blood count or electrolyte disturbance    Patient Reassessment and Ultimate Disposition/Management     Appropriate for discharge with symptomatic management as there is no indication that there is an emergent process present.  Patient management required discussion with the following services or consulting groups:  None  Complexity of Problems Addressed Acute complicated illness or Injury  Additional Data Reviewed and Analyzed Further history obtained from: None  Additional Factors Impacting ED Encounter Risk Prescriptions  Barth Kirks. [place] Small, [place] [email or web address]  Final Clinical Impressions(s) / ED Diagnoses     ICD-10-CM   1. Diarrhea of presumed infectious origin  R19.7       ED Discharge Orders          Ordered    ondansetron (ZOFRAN-ODT) 4 MG  disintegrating tablet  Every 8 hours PRN        12/04/22 0050    loperamide (IMODIUM) 2 MG capsule  4 times daily PRN        12/04/22 0050             Discharge Instructions Discussed with and Provided to Patient:    Discharge Instructions      You were evaluated in the Emergency Department and after careful evaluation, we did not find any emergent condition requiring admission or further testing in the hospital.  Your exam/testing today is  overall reassuring.  Symptoms likely due to a viral illness.  Use the Zofran as needed for nausea, use the Imodium as needed for diarrhea.  Plenty of fluids, slowly advance your diet.  Please return to the Emergency Department if you experience any worsening of your condition.   Thank you for allowing [place] to be a part of your care.      Maudie Flakes, MD 12/04/22 [phone]

## 2022-12-17 NOTE — Progress Notes (Signed)
SUBJECTIVE:   CHIEF COMPLAINT / HPI:   Deanna Mckay is a 22 y.o. female who presents to the [facility] clinic today to discuss the following concerns:   "Swelling in ankles" Has felt tingling sensation in bottom of both feet for a couple of months. Worse with prolonged sitting. The swelling started around the same time, it is intermittent. No swelling today. Swelling seems to be worse with prolonged sitting and laying flat. The swelling doesn't get "too bad". She will typically elevate her legs and it will go away within an hour.  Knee Pain Ongoing for a couple months. Has bilateral knee pain though mostly in right knee. Feels like a dull pain, constant. Putting a warm compress seems to help. Cold weather or prolonged standing/walking exacerbate the pain. Takes Ibuprofen/Tylenol for the pain. Laying down also helps to alleviate the pain. No injuries to knee. 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 Unable to tell if she has swelling in her knees. Pain is at bilateral sides of patella. Pain has not worsened or improved, staying about the same since onset. She has been trying to exercise.   Concern for DM Would like her sugar checked. Concerned about diabetes. Has been peeing frequently and feels thirsty often. Has sugar cravings. Has off and on dysuria but none now. No fevers. States that her mother just recently got diagnosed with it.   PERTINENT  PMH / PSH: Asthma, GERD, PTSD  OBJECTIVE:   BP 130/68   Pulse 72   Ht  (1.626 m)   Wt 197 lb 6.4 oz (89.5 kg)   LMP 12/09/2022   SpO2 100%   BMI 33.88 kg/m    General: NAD, pleasant, able to participate in exam Respiratory: normal effort Knee, right: TTP noted around patella in all directions and patellar tendon. Inspection was negative for erythema, ecchymosis, and effusion. No obvious bony abnormalities or signs of osteophyte development. Palpation  yielded no asymmetric warmth; Some medial joint line tenderness; No condyle tenderness; No patellar crepitus. Quadriceps tendons unremarkable, and no tenderness of the pes anserine bursa. No obvious Baker's cyst development. ROM normal in flexion (135 degrees) and extension (0 degrees). Normal hamstring and quadriceps strength. Neurovascularly intact bilaterally.  - Ligaments: (Solid and consistent endpoints)   - ACL (present bilaterally)   - PCL (present bilaterally)   - LCL (present bilaterally)   - MCL (present bilaterally).   - Additional tests performed:    - Anterior Drawer >> NEG  - Meniscus:   - Thessaly: NEG   - McMurray's: NEG  - Patella:   - Patellar glide: Without apprehension Knee, left: TTP noted at lateral and superior patella. Inspection was negative for erythema, ecchymosis, and effusion. No obvious bony abnormalities or signs of osteophyte development. Palpation yielded no asymmetric warmth; No joint line tenderness; No condyle tenderness; No patellar crepitus. Patellar and quadriceps tendons unremarkable, and no tenderness of the pes anserine bursa. No obvious Baker's cyst development. ROM normal in flexion (135 degrees) and extension (0 degrees).Normal hamstring and quadriceps strength. Neurovascularly intact bilaterally.  - Ligaments: (Solid and consistent endpoints)   - ACL (present bilaterally)   - PCL (present bilaterally)   - LCL (present bilaterally)   - MCL (present bilaterally).   - Additional tests performed:    - Anterior Drawer >> NEG  - Meniscus:   - Thessaly: NEG   -  McMurray's: NEG  - Patella:   - Patellar glide: Without apprehension Extremities: no edema to bilateral ankles Skin: warm and dry, no rashes noted Psych: Normal affect and mood  ASSESSMENT/PLAN:   1. Class 1 obesity with body mass index (BMI) of 33.0 to 33.9 in adult, unspecified obesity type, unspecified whether serious comorbidity present A1c returned normal- not diabetic or  prediabetic. Discussed lifestyle modifications for weight loss as long term goal.  - HgB A1c  2. Pain in both knees, unspecified chronicity Seems to be isolated to bilateral patella. May have component of patellar subluxation contributing.  - Ambulatory referral to Physical Therapy for quad strengthening exercises - Can continue APAP/Motrin PRN  - Weight loss   3. Ankle swelling, unspecified laterality No injuries or trauma. None appreciated today. Could be due to prolonged sitting/standing. Encouraged use of compression socks. Given that it is bilateral and improves with elevation, low suspicion for DVT or more serious pathology.    Sabino Dick, DO [facility]

## 2022-12-18 ENCOUNTER — Ambulatory Visit (INDEPENDENT_AMBULATORY_CARE_PROVIDER_SITE_OTHER): Payer: Medicaid Other | Admitting: Family Medicine

## 2022-12-18 ENCOUNTER — Encounter: Payer: Self-pay | Admitting: Family Medicine

## 2022-12-18 ENCOUNTER — Other Ambulatory Visit: Payer: Self-pay

## 2022-12-18 VITALS — BP 130/68 | HR 72 | Ht 64.0 in | Wt 197.4 lb

## 2022-12-18 DIAGNOSIS — M25562 Pain in left knee: Secondary | ICD-10-CM | POA: Diagnosis not present

## 2022-12-18 DIAGNOSIS — E669 Obesity, unspecified: Secondary | ICD-10-CM | POA: Diagnosis not present

## 2022-12-18 DIAGNOSIS — M25473 Effusion, unspecified ankle: Secondary | ICD-10-CM | POA: Diagnosis not present

## 2022-12-18 DIAGNOSIS — Z6833 Body mass index (BMI) 33.0-33.9, adult: Secondary | ICD-10-CM | POA: Diagnosis not present

## 2022-12-18 DIAGNOSIS — M25561 Pain in right knee: Secondary | ICD-10-CM | POA: Diagnosis present

## 2022-12-18 LAB — POCT GLYCOSYLATED HEMOGLOBIN (HGB A1C): Hemoglobin A1C: 5 % (ref 4.0–5.6)

## 2022-12-18 NOTE — Patient Instructions (Addendum)
It was wonderful to see you today.  Please bring ALL of your medications with you to every visit.   Today we talked about:  I am sending a referral to Physical Therapy for quadricept strengthening exercises.  Continue efforts towards weight loss. We discussed exercise recommendations of 150 minutes each week.  I would encourage you to buy compression socks to help with the swelling.  -You are overdue for a Pap smear. This is a screening test to check for signs of cervical cancer. Please schedule and appointment to return for this at your earliest convenience.    Thank you for coming to your visit as scheduled. We have had a large "no-show" problem lately, and this significantly limits our ability to see and care for patients. As a friendly reminder- if you cannot make your appointment please call to cancel. We do have a no show policy for those who do not cancel within 24 hours. Our policy is that if you miss or fail to cancel an appointment within 24 hours, 3 times in a 3840-month period, you may be dismissed from our clinic.   [facility].   Please call [phone] with any questions about today's appointment.  Please be sure to schedule follow up at the front  desk before you leave today.   Sabino DickAlejandra Harlin Mazzoni, DO PGY-3 Family Medicine    Knee Exercises Ask your health care provider which exercises are safe for you. Do exercises exactly as told by your health care provider and adjust them as directed. It is normal to feel mild stretching, pulling, tightness, or discomfort as you do these exercises. Stop right away if you feel sudden pain or your pain gets worse. Do not begin these exercises until told by your health care provider. Stretching and range-of-motion exercises These exercises warm up your muscles and joints and improve the movement and flexibility of your knee. These exercises also help to relieve pain and swelling. Knee extension,  prone  Lie on your abdomen (prone position) on a bed. Place your left / right knee just beyond the edge of the surface so your knee is not on the bed. You can put a towel under your left / right thigh just above your kneecap for comfort. Relax your leg muscles and allow gravity to straighten your knee (extension). You should feel a stretch behind your left / right knee. Hold this position for __________ seconds. Scoot up so your knee is supported between repetitions. Repeat __________ times. Complete this exercise __________ times a day. Knee flexion, active  Lie on your back with both legs straight. If this causes back discomfort, bend your left / right knee so your foot is flat on the floor. Slowly slide your left / right heel back toward your buttocks. Stop when you feel a gentle stretch in the front of your knee or thigh (flexion). Hold this position for __________ seconds. Slowly slide your left / right heel back to the starting position. Repeat __________ times. Complete this exercise __________ times a day. Quadriceps stretch, prone  Lie on your abdomen on a firm surface, such as a bed or padded floor. Bend your left / right knee and hold your ankle. If you cannot reach your ankle or pant leg, loop a belt around your foot and grab the belt instead. Gently pull your heel toward your buttocks. Your knee should not slide out to the side. You should feel a stretch in the front of your thigh  and knee (quadriceps). Hold this position for __________ seconds. Repeat __________ times. Complete this exercise __________ times a day. Hamstring, supine  Lie on your back (supine position). Loop a belt or towel over the ball of your left / right foot. The ball of your foot is on the walking surface, right under your toes. Straighten your left / right knee and slowly pull on the belt to raise your leg until you feel a gentle stretch behind your knee (hamstring). Do not let your knee bend while you  do this. Keep your other leg flat on the floor. Hold this position for __________ seconds. Repeat __________ times. Complete this exercise __________ times a day. Strengthening exercises These exercises build strength and endurance in your knee. Endurance is the ability to use your muscles for a long time, even after they get tired. Quadriceps, isometric This exercise strengthens the muscles in front of your thigh (quadriceps) without moving your knee joint (isometric). Lie on your back with your left / right leg extended and your other knee bent. Put a rolled towel or small pillow under your knee if told by your health care provider. Slowly tense the muscles in the front of your left / right thigh. You should see your kneecap slide up toward your hip or see increased dimpling just above the knee. This motion will push the back of the knee toward the floor. For __________ seconds, hold the muscle as tight as you can without increasing your pain. Relax the muscles slowly and completely. Repeat __________ times. Complete this exercise __________ times a day. Straight leg raises This exercise strengthens the muscles in front of your thigh (quadriceps) and the muscles that move your hips (hip flexors). Lie on your back with your left / right leg extended and your other knee bent. Tense the muscles in the front of your left / right thigh. You should see your kneecap slide up or see increased dimpling just above the knee. Your thigh may even shake a bit. Keep these muscles tight as you raise your leg 4-6 inches (10-15 cm) off the floor. Do not let your knee bend. Hold this position for __________ seconds. Keep these muscles tense as you lower your leg. Relax your muscles slowly and completely after each repetition. Repeat __________ times. Complete this exercise __________ times a day. Hamstring, isometric  Lie on your back on a firm surface. Bend your left / right knee about __________  degrees. Dig your left / right heel into the surface as if you are trying to pull it toward your buttocks. Tighten the muscles in the back of your thighs (hamstring) to "dig" as hard as you can without increasing any pain. Hold this position for __________ seconds. Release the tension gradually and allow your muscles to relax completely for __________ seconds after each repetition. Repeat __________ times. Complete this exercise __________ times a day. Hamstring curls If told by your health care provider, do this exercise while wearing ankle weights. Begin with __________lb / kg weights. Then increase the weight by 1 lb (0.5 kg) increments. Do not wear ankle weights that are more than __________lb / kg. Lie on your abdomen with your legs straight. Bend your left / right knee as far as you can without feeling pain. Keep your hips flat against the floor. Hold this position for __________ seconds. Slowly lower your leg to the starting position. Repeat __________ times. Complete this exercise __________ times a day. Squats This exercise strengthens the muscles in front  of your thigh and knee (quadriceps). Stand in front of a table, with your feet and knees pointing straight ahead. You may rest your hands on the table for balance but not for support. Slowly bend your knees and lower your hips like you are going to sit in a chair. Keep your weight over your heels, not over your toes. Keep your lower legs upright so they are parallel with the table legs. Do not let your hips go lower than your knees. Do not bend lower than told by your health care provider. If your knee pain increases, do not bend as low. Hold the squat position for __________ seconds. Slowly push with your legs to return to standing. Do not use your hands to pull yourself to standing. Repeat __________ times. Complete this exercise __________ times a day. Wall slides This exercise strengthens the muscles in front of your thigh and  knee (quadriceps). Lean your back against a smooth wall or door, and walk your feet out 18-24 inches (46-61 cm) from it. Place your feet hip-width apart. Slowly slide down the wall or door until your knees bend __________ degrees. Keep your knees over your heels, not over your toes. Keep your knees in line with your hips. Hold this position for __________ seconds. Repeat __________ times. Complete this exercise __________ times a day. Straight leg raises, side-lying This exercise strengthens the muscles that rotate the leg at the hip and move it away from your body (hip abductors). Lie on your side with your left / right leg in the top position. Lie so your head, shoulder, knee, and hip line up. You may bend your bottom knee to help you keep your balance. Roll your hips slightly forward so your hips are stacked directly over each other and your left / right knee is facing forward. Leading with your heel, lift your top leg 4-6 inches (10-15 cm). You should feel the muscles in your outer hip lifting. Do not let your foot drift forward. Do not let your knee roll toward the ceiling. Hold this position for __________ seconds. Slowly return your leg to the starting position. Let your muscles relax completely after each repetition. Repeat __________ times. Complete this exercise __________ times a day. Straight leg raises, prone This exercise stretches the muscles that move your hips away from the front of the pelvis (hip extensors). Lie on your abdomen on a firm surface. You can put a pillow under your hips if that is more comfortable. Tense the muscles in your buttocks and lift your left / right leg about 4-6 inches (10-15 cm). Keep your knee straight as you lift your leg. Hold this position for __________ seconds. Slowly lower your leg to the starting position. Let your leg relax completely after each repetition. Repeat __________ times. Complete this exercise __________ times a day. This  information is not intended to replace advice given to you by your health care provider. Make sure you discuss any questions you have with your health care provider. Document Revised: 05/13/2021 Document Reviewed: 05/13/2021 Elsevier Patient Education  2023 [employer].

## 2023-01-08 ENCOUNTER — Emergency Department (HOSPITAL_BASED_OUTPATIENT_CLINIC_OR_DEPARTMENT_OTHER)
Admission: EM | Admit: 2023-01-08 | Discharge: 2023-01-08 | Disposition: A | Discharge status: Home / Self Care | Payer: Medicaid Other | Attending: Emergency Medicine | Admitting: Emergency Medicine

## 2023-01-08 ENCOUNTER — Other Ambulatory Visit: Payer: Self-pay

## 2023-01-08 DIAGNOSIS — J45909 Unspecified asthma, uncomplicated: Secondary | ICD-10-CM | POA: Insufficient documentation

## 2023-01-08 DIAGNOSIS — Z7951 Long term (current) use of inhaled steroids: Secondary | ICD-10-CM | POA: Diagnosis not present

## 2023-01-08 DIAGNOSIS — Z1152 Encounter for screening for COVID-19: Secondary | ICD-10-CM | POA: Insufficient documentation

## 2023-01-08 DIAGNOSIS — R0981 Nasal congestion: Secondary | ICD-10-CM | POA: Diagnosis present

## 2023-01-08 DIAGNOSIS — J40 Bronchitis, not specified as acute or chronic: Secondary | ICD-10-CM | POA: Diagnosis not present

## 2023-01-08 LAB — RESP PANEL BY RT-PCR (RSV, FLU A&B, COVID)  RVPGX2
Influenza A by PCR: NEGATIVE
Influenza B by PCR: NEGATIVE
Resp Syncytial Virus by PCR: NEGATIVE
SARS Coronavirus 2 by RT PCR: NEGATIVE

## 2023-01-08 MED ORDER — AEROCHAMBER PLUS FLO-VU MISC
1.0000 | Freq: Once | Status: AC
  Administered 2023-01-08: 1
  Filled 2023-01-08: qty 1

## 2023-01-08 MED ORDER — DEXAMETHASONE SODIUM PHOSPHATE 10 MG/ML IJ SOLN
10.0000 mg | Freq: Once | INTRAMUSCULAR | Status: AC
  Administered 2023-01-08: 10 mg via INTRAMUSCULAR
  Filled 2023-01-08: qty 1

## 2023-01-08 MED ORDER — BENZONATATE 100 MG PO CAPS: 100.0000 mg | ORAL_CAPSULE | Freq: Three times a day (TID) | ORAL | 0 refills | Status: DC | PRN

## 2023-01-08 MED ORDER — ALBUTEROL SULFATE HFA 108 (90 BASE) MCG/ACT IN AERS
2.0000 | INHALATION_SPRAY | Freq: Once | RESPIRATORY_TRACT | Status: AC
  Administered 2023-01-08: 2 via RESPIRATORY_TRACT
  Filled 2023-01-08: qty 6.7

## 2023-01-08 NOTE — ED Triage Notes (Signed)
Pt arrived from home, POV. C/o nasal congestion, sore throat, coughing up green sputum. Taken Claritin.

## 2023-01-08 NOTE — ED Provider Notes (Signed)
Emergency Department Provider Note   I have reviewed the triage vital signs and the nursing notes.   HISTORY  Chief Complaint Nasal Congestion, Cough, and Sore Throat   HPI Deanna Mckay is a 22 y.o. female presents to the ED with cough, congestion, and nasal discharge. Symptoms ongoing for the last week. No fever. No CP or SOB. Patient has taken Claritin without relief.   Past Medical History:  Diagnosis Date   Acid reflux    Allergy    Anxiety    Asthma    prn inhaler   Bipolar and related disorder (HCC) 12/17/2015   Depression    Dyspepsia    no current med.   Eczema    Herpes 2018   Isosexual precocity    Obesity    Oppositional defiant disorder    Post traumatic stress disorder    Post-operative nausea and vomiting    Seasonal allergies     Review of Systems  Constitutional: No fever/chills Eyes: No visual changes. ENT: Positive sore throat/congestion.  Respiratory: Denies shortness of breath. Positive cough.  Gastrointestinal: No abdominal pain.   Genitourinary: Negative for dysuria. Musculoskeletal: Negative for back pain. Skin: Negative for rash. Neurological: Negative for headaches. ____________________________________________   PHYSICAL EXAM:  VITAL SIGNS: ED Triage Vitals  Enc Vitals Group     BP 01/08/23 1624 (!) 141/81     Pulse Rate 01/08/23 1624 80     Resp 01/08/23 1624 18     Temp 01/08/23 1624 99.3 F (37.4 C)     Temp Source 01/08/23 1624 Oral     SpO2 01/08/23 1624 98 %     Weight 01/08/23 1624 200 lb (90.7 kg)     Height 01/08/23 1624 5\' 4"  (1.626 m)   Constitutional: Alert and oriented. Well appearing and in no acute distress. Eyes: Conjunctivae are normal.  Head: Atraumatic. Nose: Positive congestion/rhinnorhea. Mouth/Throat: Mucous membranes are moist.  Oropharynx non-erythematous. Neck: No stridor.   Cardiovascular: Normal rate, regular rhythm. Good peripheral circulation. Grossly normal heart sounds.   Respiratory:  Normal respiratory effort.  No retractions. Lungs CTAB. Gastrointestinal:No distention.  Musculoskeletal: No gross deformities of extremities. Neurologic:  Normal speech and language.  Skin:  Skin is warm, dry and intact. No rash noted.  ____________________________________________   LABS (all labs ordered are listed, but only abnormal results are displayed)  Labs Reviewed  RESP PANEL BY RT-PCR (RSV, FLU A&B, COVID)  [id number]   ____________________________________________   PROCEDURES  Procedure(s) performed:   Procedures  None ____________________________________________   INITIAL IMPRESSION / ASSESSMENT AND PLAN / ED COURSE  Pertinent labs & imaging results that were available during my care of the patient were reviewed by me and considered in my medical decision making (see chart for details).   This patient is Presenting for Evaluation of URI, which does require a range of treatment options, and is a complaint that involves a moderate risk of morbidity and mortality.  The Differential Diagnoses include COVID, Flu, RSV, CAP, etc.  Critical Interventions-    Medications  albuterol (VENTOLIN HFA) 108 (90 Base) MCG/ACT inhaler 2 puff (has no administration in time range)  dexamethasone (DECADRON) injection 10 mg (has no administration in time range)    Reassessment after intervention: symptoms improved.   Clinical Laboratory Tests Ordered, included viral panel is negative.   Radiologic Tests: Considered CXR but lungs are CTABL.   Medical Decision Making: Summary:  Patient with viral URI symptoms. Allergies possibly playing a role as  well. Plan for continued symptom mgmt at home.    Patient's presentation is most consistent with acute, uncomplicated illness.   Disposition: discharge  ____________________________________________  FINAL CLINICAL IMPRESSION(S) / ED DIAGNOSES  Final diagnoses:  Bronchitis     NEW OUTPATIENT MEDICATIONS STARTED DURING THIS  VISIT:  New Prescriptions   BENZONATATE (TESSALON) 100 MG CAPSULE    Take 1 capsule (100 mg total) by mouth 3 (three) times daily as needed for cough.    Note:  This document was prepared using Dragon voice recognition software and may include unintentional dictation errors.  Alona Bene, MD, [facility]    Selita Staiger, Arlyss Repress, MD 01/13/23 [phone]

## 2023-01-08 NOTE — Discharge Instructions (Signed)
I am refilling your albuterol pump and gave a dose of steroid. Return to the ED with any chest pain, worsening trouble breathing, or other sudden/severe symptoms.

## 2023-01-18 ENCOUNTER — Other Ambulatory Visit (HOSPITAL_COMMUNITY)
Admission: RE | Admit: 2023-01-18 | Discharge: 2023-01-18 | Disposition: A | Discharge status: Home / Self Care | Payer: Medicaid Other | Source: Ambulatory Visit | Attending: Family Medicine | Admitting: Family Medicine

## 2023-01-18 ENCOUNTER — Encounter: Payer: Self-pay | Admitting: Family Medicine

## 2023-01-18 ENCOUNTER — Ambulatory Visit (INDEPENDENT_AMBULATORY_CARE_PROVIDER_SITE_OTHER): Payer: Medicaid Other | Admitting: Family Medicine

## 2023-01-18 ENCOUNTER — Other Ambulatory Visit: Payer: Self-pay

## 2023-01-18 VITALS — BP 133/90 | HR 78 | Ht 64.0 in | Wt 200.4 lb

## 2023-01-18 DIAGNOSIS — N898 Other specified noninflammatory disorders of vagina: Secondary | ICD-10-CM

## 2023-01-18 DIAGNOSIS — Z113 Encounter for screening for infections with a predominantly sexual mode of transmission: Secondary | ICD-10-CM | POA: Diagnosis not present

## 2023-01-18 DIAGNOSIS — K5909 Other constipation: Secondary | ICD-10-CM | POA: Diagnosis not present

## 2023-01-18 DIAGNOSIS — Z124 Encounter for screening for malignant neoplasm of cervix: Secondary | ICD-10-CM

## 2023-01-18 DIAGNOSIS — K625 Hemorrhage of anus and rectum: Secondary | ICD-10-CM

## 2023-01-18 DIAGNOSIS — R0609 Other forms of dyspnea: Secondary | ICD-10-CM | POA: Diagnosis not present

## 2023-01-18 DIAGNOSIS — Z3009 Encounter for other general counseling and advice on contraception: Secondary | ICD-10-CM | POA: Diagnosis not present

## 2023-01-18 HISTORY — DX: Other forms of dyspnea: R06.09

## 2023-01-18 NOTE — Patient Instructions (Addendum)
It was great to see you again today.  Please take miralax 1-2 times daily until you have soft daily bowel movements  Follow up in 1 month to see how your bottom is doing  Referring to nutritionist  Checking labs today - infections, pap smear, blood counts  Look at [email or web address].org for info on birth control Think about depo shots  Be well, Dr. Pollie Meyer

## 2023-01-18 NOTE — Assessment & Plan Note (Addendum)
Normal visual exam today Pap obtained Sent gc/chl/trich swabs Did not do bimanual exam today - plan to repeat at future visit

## 2023-01-18 NOTE — Progress Notes (Unsigned)
    SUBJECTIVE:   CHIEF COMPLAINT / HPI:   Deanna Mckay is a 22 yo female who presents to the clinic for her first pap smear. She is accompanied by her boyfriend.  Vaginal discharge Patient has had some milky white thick discharge, sometimes clear, sometimes with green tint for several weeks.She endorses some odor and some pain with intercourse. She is currently sexually active with her boyfriend and they do not use any contraception or barrier methods. She has also had some heavy menstrual bleeding recently. Denies pain with urination. Has hx of PID and genital herpes.  Dyspnea Patient was seen in ED on 4/26 for bronchitis. She says her symptoms from ED visit have mostly resolved. However, she notes that she started exercising a month ago had dyspnea on exertion for about a month now that she thinks is related to her weight gain. This occurs every time she exercises. Using her inhaler doesn't help and she just stops exercising when this happens and it resolves.   Constipation/Hematochezia Patient has a hx of constipation, hemorrhoids and bloody stools that recurred 3 weeks ago. Says she drinks lots of water and eats vegetables, but has not taken anything for constipation. Her last hemorrhoid resolved on its own but she thinks it is coming back.  PERTINENT  PMH / PSH:   OBJECTIVE:   BP (!) 133/90   Pulse 78   Ht 5\' 4"  (1.626 m)   Wt 200 lb 6.4 oz (90.9 kg)   LMP 12/31/2022   SpO2 100%   BMI 34.40 kg/m    Gen: alert, well appearing, in no acute distress HEENT: normocephalic, atraumatic CV: RRR, no MRG Pulm: normal WOB, some inspiratory wheezing in L upper lung field, otherwise clear Cervical: some white discharge, no abnormalities noted on speculum exam, no appreciable odor Ab: normoactive bowel sounds, no tenderness or masses Ext: no lower extremity edema   ASSESSMENT/PLAN:   Chronic constipation No hemorrhoids or fissures seen or palpated on rectal exam. Recommended  starting Miralax 1-2 times per day to improve constipation and prevent hemorrhoid/fissure progression/recurrence. Follow up in 1 month.  Vaginal discharge Complains of thick white, sometimes greenish discharge for several weeks. Having pain with intercourse. Endorses heavy menstrual bleeding recently. Sexually active with one partner without barrier or contraceptive methods. Has hx of PID and genital herpes. Performed first pap today as well as gonorrhea and chlamydia swab, and HIV/RPR blood tests. On speculum exam cervix and surrounding tissues appear normal with some clear/white discharge and no appreciable odor. Awaiting lab/micro results to determine if treatment is necessary. Also encouraged patient to use some contraceptive method, recommended depo injections. She is not ready to decide today. Resources provided for contraceptive methods. Follow up as needed.  Dyspnea on exertion Exertional dyspnea likely is due to deconditioning and/or anemia. Her Hgb was slightly low at 11.8 a month ago and she has had heavy periods recently. Rechecking CBC today to rule out anemia. Follow up in 1 month.     Barrett Shell, Medical Student [facility]

## 2023-01-18 NOTE — Assessment & Plan Note (Addendum)
Patient believes mild exertional dyspnea is related to weight & deconditioning. Check CBC today to rule out anemia. Referring to nutrition at patient's request. Follow up in 1 month.

## 2023-01-18 NOTE — Assessment & Plan Note (Signed)
No hemorrhoids or fissures seen or palpated on rectal exam. Recommended starting Miralax 1-2 times per day to improve constipation and prevent hemorrhoid/fissure progression/recurrence. Follow up in 1 month.

## 2023-01-19 LAB — RPR: RPR Ser Ql: NONREACTIVE

## 2023-01-19 LAB — CBC
Hematocrit: 37.7 % (ref 34.0–46.6)
Hemoglobin: 12.2 g/dL (ref 11.1–15.9)
MCH: 30.4 pg (ref 26.6–33.0)
MCHC: 32.4 g/dL (ref 31.5–35.7)
MCV: 94 fL (ref 79–97)
Platelets: 341 10*3/uL (ref 150–450)
RBC: 4.01 x10E6/uL (ref 3.77–5.28)
RDW: 13.3 % (ref 11.7–15.4)
WBC: 5.2 10*3/uL (ref 3.4–10.8)

## 2023-01-19 LAB — CERVICOVAGINAL ANCILLARY ONLY
Chlamydia: NEGATIVE
Comment: NEGATIVE
Comment: NEGATIVE
Comment: NORMAL
Neisseria Gonorrhea: NEGATIVE
Trichomonas: NEGATIVE

## 2023-01-19 LAB — HIV ANTIBODY (ROUTINE TESTING W REFLEX): HIV Screen 4th Generation wRfx: NONREACTIVE

## 2023-01-20 LAB — CYTOLOGY - PAP: Diagnosis: NEGATIVE

## 2023-01-20 NOTE — Assessment & Plan Note (Signed)
Encouraged patient to use some contraceptive method, recommended depo injections. She is not ready to decide today. Resources provided for contraceptive methods so she can read more.

## 2023-02-01 ENCOUNTER — Other Ambulatory Visit: Payer: Self-pay | Admitting: Family Medicine

## 2023-02-01 ENCOUNTER — Other Ambulatory Visit: Payer: Self-pay

## 2023-02-01 NOTE — Telephone Encounter (Signed)
Patient came in stating that she needs a refill for her Valtrex, and asks for it to be sent to the [facility] on [address] please
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
## 2023-02-02 ENCOUNTER — Encounter: Payer: Self-pay | Admitting: Physical Therapy

## 2023-02-02 ENCOUNTER — Ambulatory Visit: Payer: Medicaid Other | Attending: Family Medicine | Admitting: Physical Therapy

## 2023-02-02 DIAGNOSIS — M25562 Pain in left knee: Secondary | ICD-10-CM | POA: Diagnosis present

## 2023-02-02 DIAGNOSIS — G8929 Other chronic pain: Secondary | ICD-10-CM | POA: Insufficient documentation

## 2023-02-02 DIAGNOSIS — M25561 Pain in right knee: Secondary | ICD-10-CM | POA: Insufficient documentation

## 2023-02-02 MED ORDER — VALACYCLOVIR HCL 500 MG PO TABS: 500.0000 mg | ORAL_TABLET | Freq: Every day | ORAL | 1 refills | Status: DC

## 2023-02-15 ENCOUNTER — Ambulatory Visit: Payer: Medicaid Other | Admitting: Physical Therapy

## 2023-02-16 ENCOUNTER — Encounter: Payer: Self-pay | Admitting: Physical Therapy

## 2023-02-16 ENCOUNTER — Ambulatory Visit: Payer: Medicaid Other | Attending: Family Medicine | Admitting: Physical Therapy

## 2023-02-16 DIAGNOSIS — G8929 Other chronic pain: Secondary | ICD-10-CM | POA: Insufficient documentation

## 2023-02-16 DIAGNOSIS — M25561 Pain in right knee: Secondary | ICD-10-CM | POA: Insufficient documentation

## 2023-02-16 DIAGNOSIS — R600 Localized edema: Secondary | ICD-10-CM | POA: Insufficient documentation

## 2023-02-16 DIAGNOSIS — R262 Difficulty in walking, not elsewhere classified: Secondary | ICD-10-CM | POA: Insufficient documentation

## 2023-02-16 DIAGNOSIS — M25562 Pain in left knee: Secondary | ICD-10-CM | POA: Diagnosis present
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
## 2023-02-23 ENCOUNTER — Ambulatory Visit: Payer: Medicaid Other

## 2023-02-23 DIAGNOSIS — R262 Difficulty in walking, not elsewhere classified: Secondary | ICD-10-CM

## 2023-02-23 DIAGNOSIS — G8929 Other chronic pain: Secondary | ICD-10-CM

## 2023-02-23 DIAGNOSIS — R600 Localized edema: Secondary | ICD-10-CM

## 2023-02-23 DIAGNOSIS — M25561 Pain in right knee: Secondary | ICD-10-CM | POA: Diagnosis not present

## 2023-03-02 ENCOUNTER — Encounter: Payer: Self-pay | Admitting: Physical Therapy

## 2023-03-02 ENCOUNTER — Ambulatory Visit: Payer: Medicaid Other | Admitting: Physical Therapy

## 2023-03-02 DIAGNOSIS — M25561 Pain in right knee: Secondary | ICD-10-CM | POA: Diagnosis not present

## 2023-03-02 DIAGNOSIS — R600 Localized edema: Secondary | ICD-10-CM

## 2023-03-02 DIAGNOSIS — R262 Difficulty in walking, not elsewhere classified: Secondary | ICD-10-CM

## 2023-03-02 DIAGNOSIS — G8929 Other chronic pain: Secondary | ICD-10-CM
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
## 2023-03-04 ENCOUNTER — Ambulatory Visit: Payer: Medicaid Other | Admitting: Physical Therapy

## 2023-03-04 ENCOUNTER — Ambulatory Visit: Payer: Medicaid Other | Admitting: Family Medicine

## 2023-03-05 ENCOUNTER — Encounter: Payer: Medicaid Other | Admitting: Physical Therapy

## 2023-03-09 ENCOUNTER — Ambulatory Visit: Payer: Medicaid Other

## 2023-03-11 ENCOUNTER — Ambulatory Visit (INDEPENDENT_AMBULATORY_CARE_PROVIDER_SITE_OTHER): Payer: Medicaid Other | Admitting: Family Medicine

## 2023-03-11 ENCOUNTER — Encounter: Payer: Self-pay | Admitting: Family Medicine

## 2023-03-11 ENCOUNTER — Other Ambulatory Visit: Payer: Self-pay

## 2023-03-11 ENCOUNTER — Ambulatory Visit: Payer: Medicaid Other | Admitting: Physical Therapy

## 2023-03-11 VITALS — BP 128/78 | HR 74 | Ht 65.0 in | Wt 205.8 lb

## 2023-03-11 DIAGNOSIS — R198 Other specified symptoms and signs involving the digestive system and abdomen: Secondary | ICD-10-CM | POA: Insufficient documentation

## 2023-03-11 HISTORY — DX: Other specified symptoms and signs involving the digestive system and abdomen: R19.8

## 2023-03-11 NOTE — Progress Notes (Signed)
    SUBJECTIVE:   CHIEF COMPLAINT / HPI:   Patient presents for follow-up on her bowel movements.  On 5/6 for constipation-had a lot of blockage and some blood in stool, hemorrhoids. Has been taking miralax for about a week and then stopped taking it and now eats yogurt and apples, increasing fiber.  Has stopped the MiraLAX, continues with the fiber and probiotic, but now she goes after she eats and feels it is very loose about 2-3 times a day for about 3 weeks.  Denies any new medications, fever, blood in the stool. Also mentions she has been experiencing some hot flashes for the past couple of months.   PERTINENT  PMH / PSH: Reviewed   OBJECTIVE:   BP 128/78   Pulse 74   Ht 5\' 5"  (1.651 m)   Wt 205 lb 12.8 oz (93.4 kg)   SpO2 98%   BMI 34.25 kg/m   Physical exam General: well appearing, NAD Cardiovascular: RRR, no murmurs Lungs: CTAB. Normal WOB Abdomen: soft, non-distended, non-tender. Normal bowel sounds  Skin: warm, dry. No edema  ASSESSMENT/PLAN:   Abnormal bowel movement Patient presents with alternating constipation and diarrhea, currently with loose stools for the past several weeks after using daily probiotic and fiber. No recent blood in stool since last visit, no abdominal pain or infections symptoms. Physical exam benign. Will check labs for other potential causes of her abnormal bowel movements. Discussed staying well hydrated and cutting back probiotic use.  -CBC, CMP, celiac panel, sed rate, fecal calprotectin, TSH    Cora Collum, DO [facility]

## 2023-03-11 NOTE — Patient Instructions (Addendum)
It was great seeing you today!  For your diarrhea try cutting back on your probiotic to every other day. We are going to check lab work to check for other potential causes of your abnormal bowel movements.  We will call you if anything is abnormal, or we will send a MyChart message if normal.    I do recommend following up with your primary doctor in the next few weeks as well, but if you need to be seen earlier than that for any new issues we're happy to fit you in, just give [place] a call!  Feel free to call with any questions or concerns at any time, at [phone].   Take care,  Dr. Cora Collum [facility]

## 2023-03-11 NOTE — Assessment & Plan Note (Signed)
Patient presents with alternating constipation and diarrhea, currently with loose stools for the past several weeks after using daily probiotic and fiber. No recent blood in stool since last visit, no abdominal pain or infections symptoms. Physical exam benign. Will check labs for other potential causes of her abnormal bowel movements. Discussed staying well hydrated and cutting back probiotic use.  -CBC, CMP, celiac panel, sed rate, fecal calprotectin, TSH

## 2023-03-12 LAB — CELIAC AB TTG DGP TIGA: IgA/Immunoglobulin A, Serum: 104 mg/dL (ref 87–352)

## 2023-03-14 LAB — COMPREHENSIVE METABOLIC PANEL
ALT: 8 IU/L (ref 0–32)
AST: 13 IU/L (ref 0–40)
Albumin: 4 g/dL (ref 4.0–5.0)
Alkaline Phosphatase: 72 IU/L (ref 44–121)
BUN/Creatinine Ratio: 10 (ref 9–23)
BUN: 10 mg/dL (ref 6–20)
Bilirubin Total: <0.2 mg/dL (ref 0.0–1.2)
CO2: 24 mmol/L (ref 20–29)
Calcium: 8.8 mg/dL (ref 8.7–10.2)
Chloride: 103 mmol/L (ref 96–106)
Creatinine, Ser: 0.96 mg/dL (ref 0.57–1.00)
Globulin, Total: 2.7 g/dL (ref 1.5–4.5)
Glucose: 86 mg/dL (ref 70–99)
Potassium: 4.5 mmol/L (ref 3.5–5.2)
Sodium: 139 mmol/L (ref 134–144)
Total Protein: 6.7 g/dL (ref 6.0–8.5)
eGFR: 86 mL/min/{1.73_m2} (ref >59)

## 2023-03-14 LAB — CELIAC AB TTG DGP TIGA
Antigliadin Abs, IgA: 4 units (ref 0–19)
Gliadin IgG: 4 units (ref 0–19)
Tissue Transglut Ab: 5 U/mL (ref 0–5)
Transglutaminase IgA: <2 U/mL (ref 0–3)

## 2023-03-14 LAB — SEDIMENTATION RATE: Sed Rate: 12 mm/hr (ref 0–32)

## 2023-03-14 LAB — CBC
Hematocrit: 37.5 % (ref 34.0–46.6)
Hemoglobin: 11.8 g/dL (ref 11.1–15.9)
MCH: 29.9 pg (ref 26.6–33.0)
MCHC: 31.5 g/dL (ref 31.5–35.7)
MCV: 95 fL (ref 79–97)
Platelets: 348 10*3/uL (ref 150–450)
RBC: 3.94 x10E6/uL (ref 3.77–5.28)
RDW: 13.1 % (ref 11.7–15.4)
WBC: 5.1 10*3/uL (ref 3.4–10.8)

## 2023-03-14 LAB — TSH RFX ON ABNORMAL TO FREE T4: TSH: 4.16 u[IU]/mL (ref 0.450–4.500)

## 2023-04-01 ENCOUNTER — Ambulatory Visit (INDEPENDENT_AMBULATORY_CARE_PROVIDER_SITE_OTHER): Payer: MEDICAID | Admitting: Family Medicine

## 2023-04-01 ENCOUNTER — Encounter: Payer: Self-pay | Admitting: Family Medicine

## 2023-04-01 ENCOUNTER — Other Ambulatory Visit: Payer: Self-pay

## 2023-04-01 VITALS — BP 115/80 | HR 75 | Ht 63.0 in | Wt 207.8 lb

## 2023-04-01 DIAGNOSIS — R198 Other specified symptoms and signs involving the digestive system and abdomen: Secondary | ICD-10-CM | POA: Diagnosis not present

## 2023-04-01 NOTE — Progress Notes (Signed)
  Date of Visit: 04/01/2023   SUBJECTIVE:   HPI:  Prabhjot presents today for follow-up of GI issues.  I saw her on 5/6 at which time she was reporting constipation, hemorrhoids and prior bloody stools.  There was question of possible fissure on her rectal exam.  Treated with MiraLAX to evaluate for improvement.  She then followed up on 6/27 with Dr. Idalia Needle.  At that point she was complaining primarily of alternating constipation and diarrhea, mostly with diarrhea at the time of the visit.  Labs were checked to evaluate for medical causes of diarrhea, all of which were unremarkable.  CBC remained normal without any signs of anemia.  Celiac titers negative.  Today she reports still having ongoing alternating diarrhea and constipation.  Currently she is dealing primarily with diarrhea.  Reports yesterday having loose yellow stool.  Has continued having diarrhea over the last 12 hours.  Food seems to go through her quickly.  Has not seen GI doctor at all recently.  Is amenable to referral.  OBJECTIVE:   BP 115/80   Pulse 75   Ht 5\' 3"  (1.6 m)   Wt 207 lb 12.8 oz (94.3 kg)   LMP 03/26/2023   SpO2 100%   BMI 36.81 kg/m  Gen: No acute distress, pleasant, cooperative HEENT: Normocephalic, atraumatic Heart: Regular rate and rhythm, no murmur Lungs: Clear to auscultation bilaterally, normal effort Abdomen: Soft, nontender to palpation, no masses or organomegaly  ASSESSMENT/PLAN:   Abnormal bowel movement Persistent.  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  Patient agreeable to referral.  Referral placed.   [place] J. Pollie Meyer, MD [facility]

## 2023-04-01 NOTE — Patient Instructions (Signed)
It was great to see you again today.  Referring to a GI doctor Let me know if you decide you want to do anything for birth control  Be well, Dr. Pollie Meyer

## 2023-04-02 NOTE — Assessment & Plan Note (Signed)
Persistent.  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  Patient agreeable to referral.  Referral placed.

## 2023-04-30 ENCOUNTER — Ambulatory Visit (INDEPENDENT_AMBULATORY_CARE_PROVIDER_SITE_OTHER): Payer: MEDICAID

## 2023-04-30 ENCOUNTER — Encounter (HOSPITAL_COMMUNITY): Payer: Self-pay

## 2023-04-30 ENCOUNTER — Ambulatory Visit (HOSPITAL_COMMUNITY)
Admission: EM | Admit: 2023-04-30 | Discharge: 2023-04-30 | Disposition: A | Discharge status: Home / Self Care | Payer: MEDICAID | Attending: Emergency Medicine | Admitting: Emergency Medicine

## 2023-04-30 DIAGNOSIS — H109 Unspecified conjunctivitis: Secondary | ICD-10-CM | POA: Insufficient documentation

## 2023-04-30 DIAGNOSIS — Z1152 Encounter for screening for COVID-19: Secondary | ICD-10-CM | POA: Diagnosis not present

## 2023-04-30 DIAGNOSIS — R0602 Shortness of breath: Secondary | ICD-10-CM

## 2023-04-30 DIAGNOSIS — J4520 Mild intermittent asthma, uncomplicated: Secondary | ICD-10-CM | POA: Diagnosis present

## 2023-04-30 DIAGNOSIS — J40 Bronchitis, not specified as acute or chronic: Secondary | ICD-10-CM | POA: Insufficient documentation

## 2023-04-30 MED ORDER — BENZONATATE 100 MG PO CAPS: 100.0000 mg | ORAL_CAPSULE | Freq: Three times a day (TID) | ORAL | 0 refills | Status: DC

## 2023-04-30 MED ORDER — PREDNISONE 20 MG PO TABS: 40.0000 mg | ORAL_TABLET | Freq: Every day | ORAL | 0 refills | Status: AC

## 2023-04-30 MED ORDER — ERYTHROMYCIN 5 MG/GM OP OINT: TOPICAL_OINTMENT | OPHTHALMIC | 0 refills | Status: DC

## 2023-04-30 NOTE — Discharge Instructions (Addendum)
We have tested you for COVID-19 and we will contact you if your results are positive.  Results will be available positive or negative in MyChart as well.  Your x-ray did not show any signs of pneumonia.  Due to your history of asthma and persistent cough, we are going to do a steroid burst.  Please start this tomorrow with breakfast.  For your bacterial infection of your eye apply the erythromycin ointment 4 times daily for the next 5 days.  Please wash your hands before and after applying this.  If you develop eye drainage please use a Kleenex or a warm rash rag to remove it using a clean section of the rag each time.   Return to clinic for new or urgent care.

## 2023-04-30 NOTE — ED Provider Notes (Signed)
[facility]    CSN: [id number] Arrival date & time: 04/30/23  1620      History   Chief Complaint Chief Complaint  Patient presents with   Cough    HPI Deanna Mckay is a 22 y.o. female.   Patient presents to clinic for cough, nasal congestion, sore throat and fatigue that has been ongoing for the past 3 weeks. Reports shortness of breath with exertion and long conversations.  She does have a history of asthma and has not had to use her rescue inhaler. Denies fever, wheezing or chest pain.   Her right eye also has had redness, drainage and irritation since yesterday.  She does wear glasses, does not wear contacts. Denies vision changes or pain.   She recently started semaglutide injections prior to the onset of her symptoms.  She denies abdominal pain, nausea, vomiting or diarrhea.  Has tried OTC cough medications.   The history is provided by the patient and medical records.  Cough Associated symptoms: eye discharge, shortness of breath and sore throat   Associated symptoms: no chest pain, no fever and no wheezing     Past Medical History:  Diagnosis Date   Acid reflux    Allergy    Anxiety    Asthma    prn inhaler   Bipolar and related disorder (HCC) 12/17/2015   Depression    Dyspepsia    no current med.   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   Contraception management 08/28/2019   BRBPR (bright red blood per rectum) 07/19/2019   Vitamin D deficiency 02/24/2019   Genital herpes 11/18/2018   Irregular menses 02/22/2017   Insomnia 03/04/2016   Bipolar and related disorder (HCC) 12/17/2015   GERD (gastroesophageal  reflux disease) 08/18/2015   Acne 06/14/2015   Decreased visual acuity 02/27/2015   PTSD (post-traumatic stress disorder) 02/02/2015   Generalized anxiety disorder 01/29/2015   Major depression, recurrent (HCC) 01/29/2015   Abdominal pain 01/17/2015   Eczema 07/11/2012   Soy allergy 04/29/2012   Dysphagia 04/21/2012   Allergic rhinitis 03/24/2012   Chronic constipation 03/24/2012   Oppositional defiant disorder 12/24/2011   Goiter 12/14/2011   Acanthosis nigricans, acquired    Asthma     Past Surgical History:  Procedure Laterality Date   CLOSED REDUCTION AND PERCUTANEOUS PINNING OF HUMERUS FRACTURE Right 10/31/2005   supracondylar humerus fx.   CYST EXCISION Right 07/11/2002   temple area   MINOR SUPPRELIN REMOVAL Left 01/11/2014   Procedure: REMOVAL OF SUPPRELIN IMPLANT IN LEFT UPPER EXTREMITY;  Surgeon: Judie Petit. Leonia Corona, MD;  Location: [facility];  Service: Pediatrics;  Laterality: Left;   MOUTH SURGERY     SUPPRELIN IMPLANT  01/14/2012   Procedure: SUPPRELIN IMPLANT;  Surgeon: Judie Petit. Leonia Corona, MD;  Location: [facility];  Service: Pediatrics;  Laterality: Left;   TOENAIL EXCISION Right 03/19/2008   great toe    OB History     Gravida  0   Para  0   Term  0   Preterm  0   AB  0  Living  0      SAB  0   IAB  0   Ectopic  0   Multiple  0   Live Births               Home Medications    Prior to Admission medications   Medication Sig Start Date End Date Taking? Authorizing Provider  benzonatate (TESSALON) 100 MG capsule Take 1 capsule (100 mg total) by mouth every 8 (eight) hours. 04/30/23  Yes Rinaldo Ratel, [place] N, FNP  erythromycin ophthalmic ointment Place a 1/2 inch ribbon of ointment into the lower eyelid 4x daily x 5 days. 04/30/23  Yes Rinaldo Ratel, [place] N, FNP  predniSONE (DELTASONE) 20 MG tablet Take 2 tablets (40 mg total) by mouth daily for 5 days. 04/30/23 05/05/23 Yes Rinaldo Ratel, [place] N, FNP  albuterol  (PROVENTIL HFA;VENTOLIN HFA) 108 (90 Base) MCG/ACT inhaler Inhale 2 puffs into the lungs every 4 (four) hours as needed for wheezing or shortness of breath. 11/04/18   Marthenia Rolling, DO  ARIPiprazole (ABILIFY) 20 MG tablet Take 20 mg by mouth daily.    [provider]  Cholecalciferol (VITAMIN D3) 50 MCG (2000 UT) capsule Take 1 capsule (2,000 Units total) by mouth daily. 02/24/19   Leeroy Bock, MD  EPINEPHrine (EPIPEN 2-PAK) 0.3 mg/0.3 mL IJ SOAJ injection Inject 0.3 mLs (0.3 mg total) into the muscle as needed for anaphylaxis (if you use this, you must call 911 immediately). 03/27/19   Lorin Picket, NP  fluticasone (FLOVENT HFA) 44 MCG/ACT inhaler Inhale 2 puffs into the lungs in the morning and at bedtime. 05/16/20   Latrelle Dodrill, MD  hydrocortisone cream 1 % Apply 1 application topically 2 (two) times daily as needed (eczema). 07/18/19   Arlyce Harman, MD  hydrOXYzine (ATARAX) 25 MG tablet Take 12.5 mg by mouth at bedtime as needed. 03/28/22   [provider]  valACYclovir (VALTREX) 500 MG tablet Take 1 tablet (500 mg total) by mouth daily. 02/02/23   Latrelle Dodrill, MD  metFORMIN (GLUCOPHAGE) 500 MG tablet Take 1 tablet (500 mg total) by mouth 2 (two) times daily with a meal. 05/07/11 12/04/11  David Stall, MD    Family History Family History  Problem Relation Age of Onset   Stroke Mother    Asthma Mother    Depression Mother    Hypertension Father    Heart disease Father    Asthma Father    Eczema Father     Social History Social History   Tobacco Use   Smoking status: Never   Smokeless tobacco: Never  Substance Use Topics   Alcohol use: No   Drug use: Not Currently    Types: Marijuana     Allergies   Other, Trichophyton, Midazolam hcl, Penicillins, Soy allergy, Versed [midazolam hcl], Ranitidine hcl, and Zantac [ranitidine hcl]   Review of Systems Review of Systems  Constitutional:  Positive for fatigue. Negative for fever.   HENT:  Positive for congestion and sore throat.   Eyes:  Positive for discharge and redness. Negative for pain and visual disturbance.  Respiratory:  Positive for cough and shortness of breath. Negative for wheezing.   Cardiovascular:  Negative for chest pain.  Gastrointestinal:  Negative for abdominal pain, diarrhea, nausea and vomiting.     Physical Exam Triage Vital Signs ED Triage Vitals  Encounter Vitals Group     BP 04/30/23 1653 130/88     Systolic BP Percentile --  Diastolic BP Percentile --      Pulse Rate 04/30/23 1651 79     Resp 04/30/23 1651 16     Temp 04/30/23 1651 98.7 F (37.1 C)     Temp Source 04/30/23 1651 Oral     SpO2 04/30/23 1651 97 %     Weight --      Height --      Head Circumference --      Peak Flow --      Pain Score 04/30/23 1652 0     Pain Loc --      Pain Education --      Exclude from Growth Chart --    No data found.  Updated Vital Signs BP 130/88 (BP Location: Right Arm)   Pulse 79   Temp 98.7 F (37.1 C) (Oral)   Resp 16   LMP 04/22/2023 (Exact Date)   SpO2 97%   Visual Acuity Right Eye Distance:   Left Eye Distance:   Bilateral Distance:    Right Eye Near:   Left Eye Near:    Bilateral Near:     Physical Exam Vitals and nursing note reviewed.  Constitutional:      Appearance: Normal appearance.  HENT:     Head: Normocephalic and atraumatic.     Right Ear: External ear normal.     Left Ear: External ear normal.     Nose: Congestion present.     Mouth/Throat:     Mouth: Mucous membranes are moist.     Pharynx: Posterior oropharyngeal erythema present.  Eyes:     General:        Right eye: Discharge present.     Extraocular Movements: Extraocular movements intact.     Pupils: Pupils are equal, round, and reactive to light.  Cardiovascular:     Rate and Rhythm: Normal rate and regular rhythm.     Heart sounds: Normal heart sounds. No murmur heard. Pulmonary:     Effort: Pulmonary effort is normal. No  respiratory distress.     Breath sounds: Normal breath sounds.  Musculoskeletal:        General: Normal range of motion.     Cervical back: Normal range of motion.  Skin:    General: Skin is warm and dry.  Neurological:     General: No focal deficit present.     Mental Status: She is alert and oriented to person, place, and time.  Psychiatric:        Mood and Affect: Mood normal.        Behavior: Behavior normal.      UC Treatments / Results  Labs (all labs ordered are listed, but only abnormal results are displayed) Labs Reviewed  SARS CORONAVIRUS 2 (TAT 6-24 HRS)    EKG   Radiology DG Chest 2 View  Result Date: 04/30/2023 CLINICAL DATA:  SOB, cough EXAM: CHEST - 2 VIEW COMPARISON:  02/06/2021 chest radiograph. FINDINGS: Stable cardiomediastinal silhouette with normal heart size. No pneumothorax. No pleural effusion. Lungs appear clear, with no acute consolidative airspace disease and no pulmonary edema. IMPRESSION: No active cardiopulmonary disease. Electronically Signed   By: Delbert Phenix M.D.   On: 04/30/2023 17:47    Procedures Procedures (including critical care time)  Medications Ordered in UC Medications - No data to display  Initial Impression / Assessment and Plan / UC Course  I have reviewed the triage vital signs and the nursing notes.  Pertinent labs & imaging results that were available  during my care of the patient were reviewed by me and considered in my medical decision making (see chart for details).  Vitals and triage reviewed, patient is hemodynamically stable.  Over 3 weeks of nasal congestion, sore throat and cough and shortness of breath.  Lungs vesicular, heart w/ RRR. Covid-19 testing obtained per patient request. CXR negative for infiltrate or infection.  Will trial steroid burst for bronchitis/asthma.  Encouraged to use albuterol inhaler for shortness of breath if needed.  Right eye with conjunctival injection and scant purulent discharge, covered  with erythromycin ointment. No vision changes or pain. Plan of care, follow-up care and return precautions given, no questions at this time.    Final Clinical Impressions(s) / UC Diagnoses   Final diagnoses:  Bronchitis  Bacterial conjunctivitis of right eye  Mild intermittent asthma, unspecified whether complicated     Discharge Instructions      We have tested you for COVID-19 and we will contact you if your results are positive.  Results will be available positive or negative in MyChart as well.  Your x-ray did not show any signs of pneumonia.  Due to your history of asthma and persistent cough, we are going to do a steroid burst.  Please start this tomorrow with breakfast.  For your bacterial infection of your eye apply the erythromycin ointment 4 times daily for the next 5 days.  Please wash your hands before and after applying this.  If you develop eye drainage please use a Kleenex or a warm rash rag to remove it using a clean section of the rag each time.   Return to clinic for new or urgent care.        ED Prescriptions     Medication Sig Dispense Auth. Provider   erythromycin ophthalmic ointment Place a 1/2 inch ribbon of ointment into the lower eyelid 4x daily x 5 days. 3.5 g Rinaldo Ratel, [place] N, FNP   predniSONE (DELTASONE) 20 MG tablet Take 2 tablets (40 mg total) by mouth daily for 5 days. 10 tablet Rinaldo Ratel, [place] N, FNP   benzonatate (TESSALON) 100 MG capsule Take 1 capsule (100 mg total) by mouth every 8 (eight) hours. 21 capsule Quentavious Rittenhouse, [place] N, [place]      PDMP not reviewed this encounter.   Hurshel Bouillon, [place] N, [place] 04/30/23 [phone]

## 2023-04-30 NOTE — ED Triage Notes (Addendum)
Pt states cough,congestion,runny nose,sore throat for the past few weeks. States she has been taking OTC cold medicine with no relief. States this morning her right eye is red and itching.

## 2023-05-01 LAB — SARS CORONAVIRUS 2 (TAT 6-24 HRS): SARS Coronavirus 2: NEGATIVE

## 2023-05-14 ENCOUNTER — Other Ambulatory Visit (HOSPITAL_COMMUNITY)
Admission: RE | Admit: 2023-05-14 | Discharge: 2023-05-14 | Disposition: A | Discharge status: Home / Self Care | Payer: MEDICAID | Source: Ambulatory Visit | Attending: Family Medicine | Admitting: Family Medicine

## 2023-05-14 ENCOUNTER — Ambulatory Visit (INDEPENDENT_AMBULATORY_CARE_PROVIDER_SITE_OTHER): Payer: MEDICAID | Admitting: Student

## 2023-05-14 VITALS — BP 126/75 | HR 67 | Ht 64.0 in | Wt 188.1 lb

## 2023-05-14 DIAGNOSIS — J029 Acute pharyngitis, unspecified: Secondary | ICD-10-CM | POA: Diagnosis not present

## 2023-05-14 HISTORY — DX: Acute pharyngitis, unspecified: J02.9

## 2023-05-14 MED ORDER — OMEPRAZOLE 20 MG PO CPDR
20.0000 mg | DELAYED_RELEASE_CAPSULE | Freq: Every day | ORAL | 3 refills | Status: DC
Start: 2023-05-14 — End: 2023-08-18

## 2023-05-14 NOTE — Patient Instructions (Addendum)
It was wonderful to meet you today. Thank you for allowing me to be a part of your care. Below is a short summary of what we discussed at your visit today:  Today we collected a throat sample to test you for gonorrhea chlamydia and treat.  Will follow-up with you on the results.  I have sent in prescription for omeprazole in case this is possibly due to acid reflux.  Take the omeprazole once daily for 14 days and follow-up with [place] in 2 weeks.  Can do warm water honey and lemon at this time for the pain.   If you have any questions or concerns, please do not hesitate to contact us via phone or MyChart message.   Jerre Simon, MD [facility]

## 2023-05-14 NOTE — Assessment & Plan Note (Signed)
Unusual for sore throat due to viral illness to persist this long. Considering other etiologies. Given she is sexually active will swab for GC/Chlamydia. Also her report of heart burn is suspicious of acid reflux, will start trial PPI. -Swabbed patient throat for GC/Chlamydia -Rx Omeprazole daily -Follow up in 2 weeks

## 2023-05-14 NOTE — Progress Notes (Signed)
    SUBJECTIVE:   CHIEF COMPLAINT / HPI:   Patient is a 22 year old presenting for for sore throat,and  throat swelling Symptom has been present for 6 weeks Has some nasal drainage that happens every night  Treated with steroid burst in UC which provided brief relieve Has occasionally heart burn and reports history of acid reflux in the past She os sexually active and engage in oral sex  PERTINENT  PMH / PSH: Reviewed   OBJECTIVE:   BP 126/75   Pulse 67   Ht 5\' 4"  (1.626 m)   Wt 188 lb 2 oz (85.3 kg)   LMP 04/22/2023 (Exact Date)   SpO2 100%   BMI 32.29 kg/m    Physical Exam General: Alert, well appearing, NAD HEENT: MMM, No tonsillar exudate, mild oropharyngeal erythema Cardiovascular: RRR, No Murmurs, Normal S2/S2 Respiratory: CTAB, No wheezing or Rales Abdomen: No distension or tenderness  ASSESSMENT/PLAN:   Sore throat Unusual for sore throat due to viral illness to persist this long. Considering other etiologies. Given she is sexually active will swab for GC/Chlamydia. Also her report of heart burn is suspicious of acid reflux, will start trial PPI. -Swabbed patient throat for GC/Chlamydia -Rx Omeprazole daily -Follow up in 2 weeks     Jerre Simon, MD [facility]

## 2023-05-20 LAB — CERVICOVAGINAL ANCILLARY ONLY
Chlamydia: NEGATIVE
Comment: NEGATIVE
Comment: NEGATIVE
Comment: NORMAL
Neisseria Gonorrhea: NEGATIVE
Trichomonas: NEGATIVE

## 2023-06-03 ENCOUNTER — Emergency Department (HOSPITAL_BASED_OUTPATIENT_CLINIC_OR_DEPARTMENT_OTHER): Payer: MEDICAID

## 2023-06-03 ENCOUNTER — Encounter (HOSPITAL_BASED_OUTPATIENT_CLINIC_OR_DEPARTMENT_OTHER): Payer: Self-pay | Admitting: Emergency Medicine

## 2023-06-03 ENCOUNTER — Emergency Department (HOSPITAL_BASED_OUTPATIENT_CLINIC_OR_DEPARTMENT_OTHER)
Admission: EM | Admit: 2023-06-03 | Discharge: 2023-06-03 | Disposition: A | Discharge status: Home / Self Care | Payer: MEDICAID | Attending: Emergency Medicine | Admitting: Emergency Medicine

## 2023-06-03 ENCOUNTER — Other Ambulatory Visit: Payer: Self-pay

## 2023-06-03 DIAGNOSIS — N83202 Unspecified ovarian cyst, left side: Secondary | ICD-10-CM | POA: Diagnosis not present

## 2023-06-03 DIAGNOSIS — R1032 Left lower quadrant pain: Secondary | ICD-10-CM | POA: Diagnosis present

## 2023-06-03 LAB — BASIC METABOLIC PANEL
Anion gap: 6 (ref 5–15)
BUN: 8 mg/dL (ref 6–20)
CO2: 29 mmol/L (ref 22–32)
Calcium: 9.3 mg/dL (ref 8.9–10.3)
Chloride: 104 mmol/L (ref 98–111)
Creatinine, Ser: 0.82 mg/dL (ref 0.44–1.00)
GFR, Estimated: >60 mL/min (ref >60)
Glucose, Bld: 84 mg/dL (ref 70–99)
Potassium: 3.8 mmol/L (ref 3.5–5.1)
Sodium: 139 mmol/L (ref 135–145)

## 2023-06-03 LAB — LIPASE, BLOOD: Lipase: 46 U/L (ref 11–51)

## 2023-06-03 LAB — CBC
HCT: 37.2 % (ref 36.0–46.0)
Hemoglobin: 12.2 g/dL (ref 12.0–15.0)
MCH: 30.5 pg (ref 26.0–34.0)
MCHC: 32.8 g/dL (ref 30.0–36.0)
MCV: 93 fL (ref 80.0–100.0)
Platelets: 354 10*3/uL (ref 150–400)
RBC: 4 MIL/uL (ref 3.87–5.11)
RDW: 14 % (ref 11.5–15.5)
WBC: 6.2 10*3/uL (ref 4.0–10.5)
nRBC: 0 % (ref 0.0–0.2)

## 2023-06-03 LAB — URINALYSIS, ROUTINE W REFLEX MICROSCOPIC
Bilirubin Urine: NEGATIVE
Glucose, UA: NEGATIVE mg/dL
Hgb urine dipstick: NEGATIVE
Ketones, ur: NEGATIVE mg/dL
Leukocytes,Ua: NEGATIVE
Nitrite: NEGATIVE
Specific Gravity, Urine: 1.027 (ref 1.005–1.030)
pH: 6 (ref 5.0–8.0)

## 2023-06-03 LAB — PREGNANCY, URINE: Preg Test, Ur: NEGATIVE

## 2023-06-03 MED ORDER — KETOROLAC TROMETHAMINE 15 MG/ML IJ SOLN
15.0000 mg | Freq: Once | INTRAMUSCULAR | Status: AC
  Administered 2023-06-03: 15 mg via INTRAVENOUS
  Filled 2023-06-03: qty 1

## 2023-06-03 MED ORDER — IOHEXOL 300 MG/ML  SOLN
100.0000 mL | Freq: Once | INTRAMUSCULAR | Status: AC | PRN
  Administered 2023-06-03: 85 mL via INTRAVENOUS

## 2023-06-03 NOTE — Discharge Instructions (Signed)
You were seen for your abdominal pain in the emergency department. You were found to have an ovarian cyst.   At home, please take tylenol and ibuprofen for your pain.    Check your MyChart online for the results of any tests that had not resulted by the time you left the emergency department.   Follow-up with your primary doctor in 2-3 days regarding your visit.    Return immediately to the emergency department if you experience any of the following: severe abdominal pain, fevers, or any other concerning symptoms.    Thank you for visiting our Emergency Department. It was a pleasure taking care of you today.

## 2023-06-03 NOTE — ED Provider Notes (Signed)
EMERGENCY DEPARTMENT AT [facility] Provider Note   CSN: [id number] Arrival date & time: 06/03/23  1705     History  Chief Complaint  Patient presents with   Flank Pain    Deanna Mckay is a 22 y.o. female.  22 year old female who presents to the emergency department with left-sided abdominal pain.  Says that yesterday she started having pain in her left lower quadrant.  Says it is migrated up to her left upper quadrant now.  Describes it as a dull like sensation.  Worsened with movements.  Intermittent.  Currently 6/10 in severity.  No nausea, vomiting, or diarrhea.  No concerns for STIs.  No fevers.  No abdominal surgeries.       Home Medications Prior to Admission medications   Medication Sig Start Date End Date Taking? Authorizing Provider  albuterol (PROVENTIL HFA;VENTOLIN HFA) 108 (90 Base) MCG/ACT inhaler Inhale 2 puffs into the lungs every 4 (four) hours as needed for wheezing or shortness of breath. 11/04/18   Marthenia Rolling, DO  ARIPiprazole (ABILIFY) 20 MG tablet Take 20 mg by mouth daily.    [provider]  benzonatate (TESSALON) 100 MG capsule Take 1 capsule (100 mg total) by mouth every 8 (eight) hours. 04/30/23   Garrison, [place] N, FNP  Cholecalciferol (VITAMIN D3) 50 MCG (2000 UT) capsule Take 1 capsule (2,000 Units total) by mouth daily. 02/24/19   Leeroy Bock, MD  EPINEPHrine (EPIPEN 2-PAK) 0.3 mg/0.3 mL IJ SOAJ injection Inject 0.3 mLs (0.3 mg total) into the muscle as needed for anaphylaxis (if you use this, you must call 911 immediately). 03/27/19   Lorin Picket, NP  erythromycin ophthalmic ointment Place a 1/2 inch ribbon of ointment into the lower eyelid 4x daily x 5 days. 04/30/23   Garrison, [place] N, FNP  fluticasone (FLOVENT HFA) 44 MCG/ACT inhaler Inhale 2 puffs into the lungs in the morning and at bedtime. 05/16/20   Latrelle Dodrill, MD  hydrocortisone cream 1 % Apply 1 application topically 2 (two) times daily  as needed (eczema). 07/18/19   Arlyce Harman, MD  hydrOXYzine (ATARAX) 25 MG tablet Take 12.5 mg by mouth at bedtime as needed. 03/28/22   [provider]  omeprazole (PRILOSEC) 20 MG capsule Take 1 capsule (20 mg total) by mouth daily. 05/14/23   Jerre Simon, MD  valACYclovir (VALTREX) 500 MG tablet Take 1 tablet (500 mg total) by mouth daily. 02/02/23   Latrelle Dodrill, MD  metFORMIN (GLUCOPHAGE) 500 MG tablet Take 1 tablet (500 mg total) by mouth 2 (two) times daily with a meal. 05/07/11 12/04/11  David Stall, MD      Allergies    Other, Trichophyton, Midazolam hcl, Penicillins, Soy allergy, Versed [midazolam hcl], Ranitidine hcl, and Zantac [ranitidine hcl]    Review of Systems   Review of Systems  Physical Exam Updated Vital Signs BP (!) 136/94   Pulse 75   Temp 98.7 F (37.1 C) (Oral)   Resp 18   LMP 05/16/2023   SpO2 100%  Physical Exam Vitals and nursing note reviewed.  Constitutional:      General: She is not in acute distress.    Appearance: She is well-developed.  HENT:     Head: Normocephalic and atraumatic.     Right Ear: External ear normal.     Left Ear: External ear normal.     Nose: Nose normal.  Eyes:     Extraocular Movements: Extraocular movements intact.  Conjunctiva/sclera: Conjunctivae normal.     Pupils: Pupils are equal, round, and reactive to light.  Pulmonary:     Effort: Pulmonary effort is normal. No respiratory distress.  Abdominal:     General: Abdomen is flat. There is no distension.     Palpations: Abdomen is soft. There is no mass.     Tenderness: There is abdominal tenderness (Left mid abdomen, left upper quadrant). There is left CVA tenderness. There is no right CVA tenderness or guarding.  Musculoskeletal:     Cervical back: Normal range of motion and neck supple.     Right lower leg: No edema.     Left lower leg: No edema.  Skin:    General: Skin is warm and dry.  Neurological:     Mental Status: She is  alert and oriented to person, place, and time. Mental status is at baseline.  Psychiatric:        Mood and Affect: Mood normal.     ED Results / Procedures / Treatments   Labs (all labs ordered are listed, but only abnormal results are displayed) Labs Reviewed  URINALYSIS, ROUTINE W REFLEX MICROSCOPIC - Abnormal; Notable for the following components:      Result Value   Protein, ur TRACE (*)    All other components within normal limits  PREGNANCY, URINE  BASIC METABOLIC PANEL  CBC  LIPASE, BLOOD    EKG None  Radiology CT ABDOMEN PELVIS W CONTRAST  Result Date: 06/03/2023 CLINICAL DATA:  Left lower quadrant abdominal pain EXAM: CT ABDOMEN AND PELVIS WITH CONTRAST TECHNIQUE: Multidetector CT imaging of the abdomen and pelvis was performed using the standard protocol following bolus administration of intravenous contrast. RADIATION DOSE REDUCTION: This exam was performed according to the departmental dose-optimization program which includes automated exposure control, adjustment of the mA and/or kV according to patient size and/or use of iterative reconstruction technique. CONTRAST:  85mL OMNIPAQUE IOHEXOL 300 MG/ML  SOLN COMPARISON:  01/17/2015 FINDINGS: Lower chest: Lung bases are clear. Hepatobiliary: Liver is within normal limits, noting focal fat/altered fusion along the falciform ligament. Gallbladder is unremarkable. No intrahepatic or extrahepatic ductal dilatation. Pancreas: Within normal limits. Spleen: Within normal limits. Adrenals/Urinary Tract: Adrenal glands within normal limits. Kidneys are within normal limits.  No hydronephrosis. Bladder is within normal limits. Stomach/Bowel: Stomach is within normal limits. No evidence of bowel obstruction. Normal appendix (series 2/image 81). No colonic wall thickening or inflammatory changes. Vascular/Lymphatic: No evidence of abdominal aortic aneurysm. No suspicious abdominopelvic lymphadenopathy. Reproductive: Uterus and right ovary are  within normal limits. 3.6 cm fat density lesion in the left ovary/adnexa (series 2/image 70), suggesting a benign ovarian dermoid. Other: Small volume pelvic ascites, likely physiologic. Musculoskeletal: Visualized osseous structures are within normal limits. IMPRESSION: No evidence of bowel obstruction. Normal appendix. 3.6 cm fat density lesion in the left ovary/adnexa, suggesting a benign ovarian dermoid. In the setting of left lower quadrant abdominal pain, consider pelvic ultrasound with Doppler for further evaluation as clinically warranted. Electronically Signed   By: Charline Bills M.D.   On: 06/03/2023 19:59    Procedures Procedures    Medications Ordered in ED Medications  ketorolac (TORADOL) 15 MG/ML injection 15 mg (15 mg Intravenous Given 06/03/23 1826)  iohexol (OMNIPAQUE) 300 MG/ML solution 100 mL (85 mLs Intravenous Contrast Given 06/03/23 1840)    ED Course/ Medical Decision Making/ A&P Clinical Course as of 06/04/23 1029  Thu Jun 03, 2023  2013 CT ABDOMEN PELVIS W CONTRAST 3.6 cm fat density  lesion in the left ovary/adnexa, suggesting a benign ovarian dermoid. [RP]  2028 Pain has resolved.  [RP]    Clinical Course User Index [RP] Rondel Baton, MD                                 Medical Decision Making Amount and/or Complexity of Data Reviewed Labs: ordered. Radiology: ordered. Decision-making details documented in ED Course.  Risk Prescription drug management.   Deanna Mckay is a 22 y.o. female who presents emergency department with left-sided abdominal pain  Initial Ddx:  Diverticulitis, ovarian torsion, ovarian cyst, PID, pyelonephritis, kidney stone  MDM/Course:  Patient presents emergency department with left-sided abdominal pain.  Does have some mild tenderness to palpation on exam but no rebound or guarding.  Mostly in her left mid abdomen and in her left flank.  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  Was given  some Toradol and upon re-evaluation was not having any pain so very low concern for torsion at this time.  Urinalysis without any signs of infection that would suggest coexisting pyelonephritis.  Did talk to the patient about PID and STIs as the cause of her pain but she states that she is not concerned at all about STIs and so will not obtain testing today.  Will have her follow-up with OB/GYN regarding her ovarian cyst which I suspect is the cause of her pain.  This patient presents to the ED for concern of complaints listed in HPI, this involves an extensive number of treatment options, and is a complaint that carries with it a high risk of complications and morbidity. Disposition including potential need for admission considered.   Dispo: DC Home. Return precautions discussed including, but not limited to, those listed in the AVS. Allowed pt time to ask questions which were answered fully prior to dc.  Records reviewed Outpatient Clinic Notes The following labs were independently interpreted: Urinalysis and show no acute abnormality I independently reviewed the following imaging with scope of interpretation limited to determining acute life threatening conditions related to emergency care: CT Abdomen/Pelvis and agree with the radiologist interpretation with the following exceptions: none I personally reviewed and interpreted the pt's EKG: see above for interpretation  I have reviewed the patients home medications and made adjustments as needed   Final Clinical Impression(s) / ED Diagnoses Final diagnoses:  Cyst of left ovary  Left lower quadrant abdominal pain    Rx / DC Orders ED Discharge Orders     None         Rondel Baton, MD 06/04/23 1029

## 2023-06-03 NOTE — ED Triage Notes (Signed)
Left sided flank pain started yesterday. 6/10 pain No n/v, mild loss of appetite Some discomfort with urination  On semaglutide for last month

## 2023-06-08 ENCOUNTER — Ambulatory Visit: Payer: MEDICAID | Admitting: Gastroenterology

## 2023-06-10 ENCOUNTER — Other Ambulatory Visit: Payer: Self-pay

## 2023-06-10 ENCOUNTER — Ambulatory Visit (INDEPENDENT_AMBULATORY_CARE_PROVIDER_SITE_OTHER): Payer: MEDICAID | Admitting: Family Medicine

## 2023-06-10 VITALS — BP 112/71 | HR 83 | Ht 64.0 in | Wt 184.4 lb

## 2023-06-10 DIAGNOSIS — Z0000 Encounter for general adult medical examination without abnormal findings: Secondary | ICD-10-CM

## 2023-06-10 DIAGNOSIS — J029 Acute pharyngitis, unspecified: Secondary | ICD-10-CM

## 2023-06-10 DIAGNOSIS — R634 Abnormal weight loss: Secondary | ICD-10-CM

## 2023-06-10 DIAGNOSIS — E66811 Obesity, class 1: Secondary | ICD-10-CM

## 2023-06-10 DIAGNOSIS — Z3009 Encounter for other general counseling and advice on contraception: Secondary | ICD-10-CM

## 2023-06-10 DIAGNOSIS — K219 Gastro-esophageal reflux disease without esophagitis: Secondary | ICD-10-CM

## 2023-06-10 DIAGNOSIS — E669 Obesity, unspecified: Secondary | ICD-10-CM | POA: Diagnosis not present

## 2023-06-10 DIAGNOSIS — D271 Benign neoplasm of left ovary: Secondary | ICD-10-CM

## 2023-06-10 DIAGNOSIS — Z6831 Body mass index (BMI) 31.0-31.9, adult: Secondary | ICD-10-CM

## 2023-06-10 NOTE — Assessment & Plan Note (Addendum)
Patient interested in IUD. Given dermoid cyst will double check with OBGYN colleague before placing IUD. Follow-up appointment to be scheduled for IUD insertion once hear back from OB/GYN. Will need repeat STI testing prior to placement given history of PID.

## 2023-06-10 NOTE — Patient Instructions (Addendum)
It was great to see you again today.  Continue omeprazole I will message an OBGYN about if they are ok with you having an IUD placed.  I will send you a message with what they say.  Schedule an appointment to discuss weight loss medication.  Be well, Dr. Pollie Meyer

## 2023-06-10 NOTE — Assessment & Plan Note (Addendum)
Improving. Continue omeprazole. Given patient is endorsing globus sensation, recommend following with GI. Per chart review, patient has an appointment with GI on 09/02/23.

## 2023-06-10 NOTE — Progress Notes (Signed)
   SUBJECTIVE:   CHIEF COMPLAINT / HPI:   Deanna Mckay is a 22 y.o. female presenting for follow up.  Throat Pain: Patient reports pain is improving. Endorses continued drainage. Reports the omeprazole has helped her symptoms. Denies any fever, weight loss or chills. Patient endorses feeling her food come up with burps and feelings of food getting stuck in her throat sometimes. Has appointment with GI upcoming  Contraception: Patient is interested in an IUD and would like to schedule an appointment for insertion. Patient denies any currently contraception method.   Weight Loss: Patient reports getting weekly injections of semiglutide at [facility] in [place] starting this past August. Patient interested in weight loss discussion and medications through PCP.   Additionally, patient reports she was seen in the ED on 06/03/23 for L abdominal pain. CT scan revealed ovarian dermoid cyst. Patient requesting OB/GYN referral.   PERTINENT  PMH / PSH: GERD  OBJECTIVE:   BP 112/71   Pulse 83   Ht 5\' 4"  (1.626 m)   Wt 184 lb 6.4 oz (83.6 kg)   LMP 05/16/2023   SpO2 100%   BMI 31.65 kg/m    General: NAD, pleasant, cooperative HEENT: No tonsillar erythema appreciated, no lymphadenopathy  Cardiac: RRR, no murmurs Respiratory: Clear to auscultation bilaterally, normal work of breathing  ASSESSMENT/PLAN:   Assessment & Plan Sore throat Improving. Given improvement, suspect viral etiology or symptom of GERD most likely. Continue omeprazole as needed.  Encounter for other general counseling or advice on contraception Patient interested in IUD. Given dermoid cyst will double check with OBGYN colleague before placing IUD. Follow-up appointment to be scheduled for IUD insertion once hear back from OB/GYN. Will need repeat STI testing prior to placement given history of PID. Class 1 obesity with body mass index (BMI) of 31.0 to 31.9 in adult, unspecified obesity type,  unspecified whether serious comorbidity present Patient currently receiving semaglutide at [facility] in [place] - reviewed this clinic's website with patient, it is a naturopathic doctor who advertises herself as a Research scientist (physical sciences)" and accepts cash. Advised patient that [place] Medicaid is now covering weight loss medication for obesity and that we are able to treat her here at the [facility]. Patient advised to schedule appointment to discuss in more detail. Routine adult health maintenance - Flu vaccine offered; patient declined - COVID-19 vaccine offered; patient declined - Tdap vaccine offered; patient declined  Gastroesophageal reflux disease, unspecified whether esophagitis present Improving. Continue omeprazole. Given patient is endorsing globus sensation, recommend following with GI. Per chart review, patient has an appointment with GI on 09/02/23.  FOLLOW-UP: Patient to follow up on 06/21/23 for follow-up appointment to discuss weight loss.   Bubba Hales, Medical Student [facility]  Patient seen along with medical student Filomena Jungling. I personally evaluated this patient along with the student, and verified all aspects of the history, physical exam, and medical decision making as documented by the student. I agree with the student's documentation and have made all necessary edits.  Levert Feinstein, MD  [facility]

## 2023-06-10 NOTE — Assessment & Plan Note (Deleted)
Patient currently receiving semaglutide at [facility] in [place]. Patient advised to stop semaglutide through [facility] since validity of clinic and medication is uncertain. Patient advised to schedule appointment to discuss weight loss management and possible medication options prescribed through PCP.

## 2023-06-10 NOTE — Assessment & Plan Note (Addendum)
Improving. Given improvement, suspect viral etiology or symptom of GERD most likely. Continue omeprazole as needed.

## 2023-06-12 DIAGNOSIS — Z0000 Encounter for general adult medical examination without abnormal findings: Secondary | ICD-10-CM | POA: Insufficient documentation

## 2023-06-12 DIAGNOSIS — D271 Benign neoplasm of left ovary: Secondary | ICD-10-CM | POA: Insufficient documentation

## 2023-06-12 HISTORY — DX: Encounter for general adult medical examination without abnormal findings: Z00.00

## 2023-06-12 NOTE — Assessment & Plan Note (Signed)
-   Flu vaccine offered; patient declined - COVID-19 vaccine offered; patient declined - Tdap vaccine offered; patient declined

## 2023-06-12 NOTE — Assessment & Plan Note (Signed)
Patient currently receiving semaglutide at [facility] in [place] - reviewed this clinic's website with patient, it is a naturopathic doctor who advertises herself as a Research scientist (physical sciences)" and accepts cash. Advised patient that [place] Medicaid is now covering weight loss medication for obesity and that we are able to treat her here at the [facility]. Patient advised to schedule appointment to discuss in more detail.

## 2023-06-12 NOTE — Addendum Note (Signed)
Addended by: Latrelle Dodrill on: 06/12/2023 08:50 PM   Modules accepted: Orders

## 2023-06-15 ENCOUNTER — Encounter: Payer: Self-pay | Admitting: Family Medicine

## 2023-06-21 ENCOUNTER — Other Ambulatory Visit: Payer: Self-pay

## 2023-06-21 ENCOUNTER — Encounter: Payer: Self-pay | Admitting: Family Medicine

## 2023-06-21 ENCOUNTER — Ambulatory Visit: Payer: MEDICAID | Admitting: Family Medicine

## 2023-06-21 VITALS — BP 129/76 | HR 78 | Ht 64.0 in | Wt 182.6 lb

## 2023-06-21 DIAGNOSIS — E66811 Obesity, class 1: Secondary | ICD-10-CM | POA: Diagnosis not present

## 2023-06-21 DIAGNOSIS — Z23 Encounter for immunization: Secondary | ICD-10-CM | POA: Diagnosis not present

## 2023-06-21 DIAGNOSIS — Z0000 Encounter for general adult medical examination without abnormal findings: Secondary | ICD-10-CM

## 2023-06-21 DIAGNOSIS — Z6831 Body mass index (BMI) 31.0-31.9, adult: Secondary | ICD-10-CM

## 2023-06-21 DIAGNOSIS — D271 Benign neoplasm of left ovary: Secondary | ICD-10-CM

## 2023-06-21 DIAGNOSIS — Z3009 Encounter for other general counseling and advice on contraception: Secondary | ICD-10-CM

## 2023-06-21 MED ORDER — WEGOVY 0.25 MG/0.5ML ~~LOC~~ SOAJ: 0.2500 mg | SUBCUTANEOUS | 1 refills | Status: DC

## 2023-06-21 NOTE — Patient Instructions (Addendum)
It was great to see you again today.  Sent in [place] for you - 0.25mg  weekly Will have to see if insurance will cover this  Tdap vaccine today  Follow up with GYN office as scheduled  Follow up with me in 1 month   Be well, Dr. Pollie Meyer

## 2023-06-21 NOTE — Progress Notes (Unsigned)
  Date of Visit: 06/21/2023   SUBJECTIVE:   HPI:  Deanna Mckay presents today for discussion of weight loss medication.  Weight has been a struggle for about 2 years. Has had challenges with eating more than she should. Would also eat unhealthy foods like fast food. Has noticed weight going up, looks different in pictures, knees and feet more sore with ambulating while her weight has gone up.   In July saw her psychiatrist Dr. Jannifer Franklin who recommended she go see the naturopathic doctor who was giving her semaglutide injections for $50 per week.  Has worked on cutting back on fatty foods and carbs. Has been able to lose 20lb since July with the semaglutide injections she was paying for cash, along with the dietary changes.  Has been trying to decrease intake of sugary foods as well. Exercise has been a bit of a challenge but tries to stay active as much as possible.  Since losing some weight has feels better, lighter, self confidence has improved.  Has upcoming appointment with GYN to discuss ovarian cyst noted on recent imaging.  Desires to wait until that visit before beginning any new forms of birth control.   OBJECTIVE:   BP 129/76   Pulse 78   Ht 5\' 4"  (1.626 m)   Wt 182 lb 9.6 oz (82.8 kg)   LMP 05/16/2023   SpO2 98%   BMI 31.34 kg/m  Gen: no acute distress, pleasant, cooperative, well-appearing HEENT: Normocephalic, atraumatic Lungs: Normal respiratory effort on room air Neuro: Grossly nonfocal, speech normal Ext: No edema  ASSESSMENT/PLAN:   Assessment & Plan Class 1 obesity with body mass index (BMI) of 31.0 to 31.9 in adult, unspecified obesity type, unspecified whether serious comorbidity present Has demonstrated good effort with lifestyle changes to help with weight loss.  Unfortunately, my suspicion is that her ongoing need for antipsychotic therapy to control her mood will make losing weight on her own without medication assistance very difficult.  I believe  ongoing prescription under my care is safer and ultimately better for her than seeing a naturopath and paying cash out-of-pocket.  As she has been off semaglutide for several weeks, will restart at 0.25 mg weekly.  Rx sent in. Routine adult health maintenance Tdap given today Dermoid cyst of left ovary Has upcoming appointment with GYN to discuss management.  Had planned to perform STI testing today with subsequent visit for IUD insertion, however patient prefers to wait until she sees GYN for all of this. Encounter for other general counseling or advice on contraception Patient defers IUD for now, wants to talk with GYN first.    FOLLOW UP: Follow up in 1 month for obesity  Deanna J. Pollie Meyer, MD [facility]

## 2023-06-23 NOTE — Assessment & Plan Note (Signed)
Has demonstrated good effort with lifestyle changes to help with weight loss.  Unfortunately, my suspicion is that her ongoing need for antipsychotic therapy to control her mood will make losing weight on her own without medication assistance very difficult.  I believe ongoing prescription under my care is safer and ultimately better for her than seeing a naturopath and paying cash out-of-pocket.  As she has been off semaglutide for several weeks, will restart at 0.25 mg weekly.  Rx sent in.

## 2023-06-23 NOTE — Assessment & Plan Note (Signed)
Tdap given today.

## 2023-06-23 NOTE — Assessment & Plan Note (Signed)
Patient defers IUD for now, wants to talk with GYN first.

## 2023-06-23 NOTE — Assessment & Plan Note (Signed)
Has upcoming appointment with GYN to discuss management.  Had planned to perform STI testing today with subsequent visit for IUD insertion, however patient prefers to wait until she sees GYN for all of this.

## 2023-07-02 ENCOUNTER — Telehealth: Payer: Self-pay

## 2023-07-02 NOTE — Telephone Encounter (Signed)
Patient LVM on nurse line in regards to [facility].   She reports her pharmacy stated her insurance does not cover the medication and to contact her PCP for an alternative.   Will forward to PCP and [facility].

## 2023-07-05 ENCOUNTER — Other Ambulatory Visit (HOSPITAL_COMMUNITY): Payer: Self-pay

## 2023-07-08 NOTE — Telephone Encounter (Signed)
PA submitted via covermymeds.  Waiting for next steps of clinical questions.   Will check back.

## 2023-07-08 NOTE — Telephone Encounter (Signed)
Clinical questions submitted via covermymeds.  Key: [id number]

## 2023-07-12 NOTE — Telephone Encounter (Signed)
Pharmacy Patient Advocate Encounter  Received notification from [facility] that Prior Authorization for [facility] has been DENIED.  Full denial letter will be uploaded to the media tab. See denial reason below.

## 2023-07-13 ENCOUNTER — Encounter: Payer: Self-pay | Admitting: Family Medicine

## 2023-07-27 ENCOUNTER — Encounter: Payer: Self-pay | Admitting: Family Medicine

## 2023-07-27 ENCOUNTER — Ambulatory Visit (INDEPENDENT_AMBULATORY_CARE_PROVIDER_SITE_OTHER): Payer: MEDICAID | Admitting: Family Medicine

## 2023-07-27 VITALS — BP 123/55 | HR 74 | Ht 64.0 in | Wt 184.6 lb

## 2023-07-27 DIAGNOSIS — J4520 Mild intermittent asthma, uncomplicated: Secondary | ICD-10-CM | POA: Diagnosis not present

## 2023-07-27 DIAGNOSIS — Z0000 Encounter for general adult medical examination without abnormal findings: Secondary | ICD-10-CM | POA: Diagnosis not present

## 2023-07-27 DIAGNOSIS — E66811 Obesity, class 1: Secondary | ICD-10-CM | POA: Diagnosis not present

## 2023-07-27 DIAGNOSIS — Z6831 Body mass index (BMI) 31.0-31.9, adult: Secondary | ICD-10-CM

## 2023-07-27 DIAGNOSIS — R350 Frequency of micturition: Secondary | ICD-10-CM | POA: Diagnosis not present

## 2023-07-27 DIAGNOSIS — L739 Follicular disorder, unspecified: Secondary | ICD-10-CM

## 2023-07-27 LAB — POCT URINALYSIS DIP (MANUAL ENTRY)
Bilirubin, UA: NEGATIVE
Blood, UA: NEGATIVE
Glucose, UA: NEGATIVE mg/dL
Ketones, POC UA: NEGATIVE mg/dL
Leukocytes, UA: NEGATIVE
Nitrite, UA: NEGATIVE
Protein Ur, POC: NEGATIVE mg/dL
Spec Grav, UA: 1.015 (ref 1.010–1.025)
Urobilinogen, UA: 0.2 U/dL
pH, UA: 7 (ref 5.0–8.0)

## 2023-07-27 LAB — POCT GLYCOSYLATED HEMOGLOBIN (HGB A1C): Hemoglobin A1C: 4.9 % (ref 4.0–5.6)

## 2023-07-27 NOTE — Patient Instructions (Signed)
It was great to see you again today.  Checking urine for signs of infection, and also diabetes test.  Leave the bump under your skin alone, hopefully will go away on its own  Keep up the great work with healthy eating and staying active.  Be well, Dr. Pollie Meyer

## 2023-07-27 NOTE — Assessment & Plan Note (Signed)
Not using Flovent consistently enough to receive any benefit.  Stop entirely.  Continue as needed albuterol.

## 2023-07-27 NOTE — Assessment & Plan Note (Signed)
Continues to demonstrate excellent lifestyle changes, with staying physically active and adjusting eating habits. Despite these changes weight is up 2 pounds from last visit. Would benefit from medication assistance in weight loss to prevent cardiovascular disease Again we will see if her insurance will cover semaglutide

## 2023-07-27 NOTE — Progress Notes (Signed)
  Date of Visit: 07/27/2023   SUBJECTIVE:   HPI:  Evyanna presents today for follow-up.  Obesity -working on controlling portions - eats fistfull, limits to half -counting calories - 1600 per day -drinks lots of water -eating more regularly (5-6pm dinner rather than later in evening) -still working on eating breakfast regularly, but has been doing protein shakes if misses breakfast, also fruit -lunch - varies, sometimes small thing of pasta, tries to get veggies in, sometimes just meat & veggies -tries to be as physically active as possible -Lives near [employer] and is planning to go more consistently after work  Urinary frequency: Present for about 3 weeks.  No dysuria.  Has been drinking more water notices she urinates a lot more frequently.  No fever.  Bump on skin: Noticed a bump in her suprapubic area beneath the skin.  Has not been able to see any issues in the area.  It has been present for about a week.  Asthma: Rarely using albuterol.  Has been using Flovent only as needed, every few months.  OBJECTIVE:   BP (!) 123/55   Pulse 74   Ht 5\' 4"  (1.626 m)   Wt 184 lb 9.6 oz (83.7 kg)   SpO2 100%   BMI 31.69 kg/m  Gen: No acute distress, pleasant, cooperative, well-appearing HEENT: Normocephalic, atraumatic Lungs: Normal respiratory effort Neuro: Alert, grossly nonfocal, speech normal Derm: Mild area of folliculitis which appears to be resolving in suprapubic area  ASSESSMENT/PLAN:   Assessment & Plan Urinary frequency Suspect related to drinking more water.  Does not seem to be UTI based on history.  UA without signs of infection.  Check A1c to ensure no diabetes, though lower suspicion of this as well. Class 1 obesity with body mass index (BMI) of 31.0 to 31.9 in adult, unspecified obesity type, unspecified whether serious comorbidity present Continues to demonstrate excellent lifestyle changes, with staying physically active and adjusting eating  habits. Despite these changes weight is up 2 pounds from last visit. Would benefit from medication assistance in weight loss to prevent cardiovascular disease Again we will see if her insurance will cover semaglutide  Routine adult health maintenance Declined COVID-vaccine today Mild intermittent asthma without complication Not using Flovent consistently enough to receive any benefit.  Stop entirely.  Continue as needed albuterol. Folliculitis Mild, improving.  Advised leaving it alone, avoid picking at it.  Follow-up if not improving.     [place] J. Pollie Meyer, MD [facility]

## 2023-07-27 NOTE — Assessment & Plan Note (Signed)
Declined COVID vaccine today

## 2023-08-02 ENCOUNTER — Ambulatory Visit: Payer: MEDICAID | Admitting: Family Medicine

## 2023-08-16 ENCOUNTER — Other Ambulatory Visit: Payer: Self-pay | Admitting: Student

## 2023-08-16 DIAGNOSIS — J029 Acute pharyngitis, unspecified: Secondary | ICD-10-CM

## 2023-08-30 ENCOUNTER — Ambulatory Visit (INDEPENDENT_AMBULATORY_CARE_PROVIDER_SITE_OTHER): Payer: MEDICAID | Admitting: Obstetrics and Gynecology

## 2023-08-30 ENCOUNTER — Encounter: Payer: Self-pay | Admitting: Obstetrics and Gynecology

## 2023-08-30 VITALS — BP 127/83 | HR 78 | Ht 64.0 in | Wt 186.0 lb

## 2023-08-30 DIAGNOSIS — N83202 Unspecified ovarian cyst, left side: Secondary | ICD-10-CM | POA: Diagnosis not present

## 2023-08-30 DIAGNOSIS — Z1339 Encounter for screening examination for other mental health and behavioral disorders: Secondary | ICD-10-CM | POA: Diagnosis not present

## 2023-08-30 NOTE — Patient Instructions (Signed)

## 2023-08-30 NOTE — Progress Notes (Signed)
22 yo P0 with LMP 08/11/23 and BMI 31 presenting for ED follow up. Patient was seen for left lower quadrant pain and an ovarian cyst was incidentally discovered on CT scan. Patient reports intermittent left lower quadrant pain, which she describes as sharp. She reports a monthly period lasting 5-7 days with dysmenorrhea. She is sexually active without contraceptions. She is without any other complaints  Past Medical History:  Diagnosis Date   Acid reflux    Allergy    Anxiety    Asthma    prn inhaler   Bipolar and related disorder (HCC) 12/17/2015   Depression    Dyspepsia    no current med.   Eczema    Herpes 2018   Isosexual precocity    Obesity    Oppositional defiant disorder    Post traumatic stress disorder    Post-operative nausea and vomiting    Seasonal allergies    Past Surgical History:  Procedure Laterality Date   CLOSED REDUCTION AND PERCUTANEOUS PINNING OF HUMERUS FRACTURE Right 10/31/2005   supracondylar humerus fx.   CYST EXCISION Right 07/11/2002   temple area   MINOR SUPPRELIN REMOVAL Left 01/11/2014   Procedure: REMOVAL OF SUPPRELIN IMPLANT IN LEFT UPPER EXTREMITY;  Surgeon: Judie Petit. Leonia Corona, MD;  Location: [facility];  Service: Pediatrics;  Laterality: Left;   MOUTH SURGERY     SUPPRELIN IMPLANT  01/14/2012   Procedure: SUPPRELIN IMPLANT;  Surgeon: Judie Petit. Leonia Corona, MD;  Location: [facility];  Service: Pediatrics;  Laterality: Left;   TOENAIL EXCISION Right 03/19/2008   great toe   Family History  Problem Relation Age of Onset   Stroke Mother    Asthma Mother    Depression Mother    Hypertension Father    Heart disease Father    Asthma Father    Eczema Father    Social History   Tobacco Use   Smoking status: Never   Smokeless tobacco: Never  Substance Use Topics   Alcohol use: No   Drug use: Not Currently    Types: Marijuana   ROS See pertinent in HPI. All other systems reviewed and non contributory Blood pressure  127/83, pulse 78, height 5\' 4"  (1.626 m), weight 186 lb (84.4 kg), last menstrual period 08/11/2023.  GENERAL: Well-developed, well-nourished female in no acute distress.  HEENT: Normocephalic, atraumatic. Sclerae anicteric.  NECK: Supple. Normal thyroid.  LUNGS: Clear to auscultation bilaterally.  HEART: Regular rate and rhythm. BREASTS: Symmetric in size. No palpable masses or lymphadenopathy, skin changes, or nipple drainage. ABDOMEN: Soft, nontender, nondistended. No organomegaly. PELVIC: Normal external female genitalia. Vagina is pink and rugated.  Normal discharge. Normal appearing cervix. Uterus is normal in size. No adnexal mass or tenderness. Chaperone present during the pelvic exam EXTREMITIES: No cyanosis, clubbing, or edema, 2+ distal pulses.  CT ABDOMEN PELVIS W CONTRAST Result Date: 06/03/2023 CLINICAL DATA:  Left lower quadrant abdominal pain EXAM: CT ABDOMEN AND PELVIS WITH CONTRAST TECHNIQUE: Multidetector CT imaging of the abdomen and pelvis was performed using the standard protocol following bolus administration of intravenous contrast. RADIATION DOSE REDUCTION: This exam was performed according to the departmental dose-optimization program which includes automated exposure control, adjustment of the mA and/or kV according to patient size and/or use of iterative reconstruction technique. CONTRAST:  85mL OMNIPAQUE IOHEXOL 300 MG/ML  SOLN COMPARISON:  01/17/2015 FINDINGS: Lower chest: Lung bases are clear. Hepatobiliary: Liver is within normal limits, noting focal fat/altered fusion along the falciform ligament. Gallbladder is unremarkable.  No intrahepatic or extrahepatic ductal dilatation. Pancreas: Within normal limits. Spleen: Within normal limits. Adrenals/Urinary Tract: Adrenal glands within normal limits. Kidneys are within normal limits.  No hydronephrosis. Bladder is within normal limits. Stomach/Bowel: Stomach is within normal limits. No evidence of bowel obstruction. Normal  appendix (series 2/image 81). No colonic wall thickening or inflammatory changes. Vascular/Lymphatic: No evidence of abdominal aortic aneurysm. No suspicious abdominopelvic lymphadenopathy. Reproductive: Uterus and right ovary are within normal limits. 3.6 cm fat density lesion in the left ovary/adnexa (series 2/image 70), suggesting a benign ovarian dermoid. Other: Small volume pelvic ascites, likely physiologic. Musculoskeletal: Visualized osseous structures are within normal limits. IMPRESSION: No evidence of bowel obstruction. Normal appendix. 3.6 cm fat density lesion in the left ovary/adnexa, suggesting a benign ovarian dermoid. In the setting of left lower quadrant abdominal pain, consider pelvic ultrasound with Doppler for further evaluation as clinically warranted. Electronically Signed   By: Charline Bills M.D.   On: 06/03/2023 19:59     A/P 22 yo with incidental finding of possible dermoid cyst on CT scan - CT scan results reviewed with the patient - Pelvic ultrasound ordered  - Discussed surgical management options for dermoid cyst - Patient is current on pap smear - Patient will be contacted with ultrasound report and further management

## 2023-08-30 NOTE — Progress Notes (Signed)
22 y.o. New GYN presents for ED follow up of cyst on Ovary.  Last PAP 01/18/2023

## 2023-09-02 ENCOUNTER — Ambulatory Visit (HOSPITAL_BASED_OUTPATIENT_CLINIC_OR_DEPARTMENT_OTHER)
Admission: RE | Admit: 2023-09-02 | Discharge: 2023-09-02 | Disposition: A | Discharge status: Home / Self Care | Payer: MEDICAID | Source: Ambulatory Visit | Attending: Obstetrics and Gynecology | Admitting: Obstetrics and Gynecology

## 2023-09-02 ENCOUNTER — Ambulatory Visit (INDEPENDENT_AMBULATORY_CARE_PROVIDER_SITE_OTHER): Payer: MEDICAID | Admitting: Nurse Practitioner

## 2023-09-02 ENCOUNTER — Encounter: Payer: Self-pay | Admitting: Nurse Practitioner

## 2023-09-02 VITALS — BP 130/80 | HR 78 | Ht 64.0 in | Wt 185.0 lb

## 2023-09-02 DIAGNOSIS — N83202 Unspecified ovarian cyst, left side: Secondary | ICD-10-CM | POA: Diagnosis present

## 2023-09-02 DIAGNOSIS — K625 Hemorrhage of anus and rectum: Secondary | ICD-10-CM | POA: Diagnosis not present

## 2023-09-02 DIAGNOSIS — K219 Gastro-esophageal reflux disease without esophagitis: Secondary | ICD-10-CM

## 2023-09-02 DIAGNOSIS — Z8719 Personal history of other diseases of the digestive system: Secondary | ICD-10-CM | POA: Diagnosis not present

## 2023-09-02 DIAGNOSIS — K5909 Other constipation: Secondary | ICD-10-CM

## 2023-09-02 MED ORDER — LUBIPROSTONE 8 MCG PO CAPS: 8.0000 ug | ORAL_CAPSULE | Freq: Two times a day (BID) | ORAL | 5 refills | Status: DC

## 2023-09-02 NOTE — Progress Notes (Signed)
Brief Narrative 22 y.o. yo female , new to the practice , with a past medical history of chronic  constipation. Referred by PCP for abnormal bowel movements   ASSESSMENT    22 year old female with chronic (years) constipation defined as decreased frequency of BMs with hard stool.  No improvement with stool softeners, as needed MiraLAX and increased water intake.  Suspect idiopathic chronic constipation  Chronic intermittent rectal bleeding with bowel movements.  She has a history of hemorrhoids.  Probably has internal hemorrhoid bleeding.   She declines rectal exam today  ? GERD.   Describes feeling like food is still in her throat when she wakes up in the morning. No dysphagia.  Started Prilosec 2 months ago with improvement in symptoms  See PMH for any additional medical history   PLAN   --Trial of Amitiza 8 mcg twice daily #60 with 5 refills -- Continue to consume as much water throughout the day as possible with goal being 60 ounces a day -- Empirically treat for hemorrhoidal bleeding with Anusol cream per rectum nightly x 10 days.  -- If bleeding does not improve resolve with treatment of constipation and hemorrhoids then further evaluation is recommended.  She declined a rectal exam today but again I suspect this is probably hemorrhoidal bleeding --Continue omeprazole 20 mg daily.  Recommend she change to taking it 30 - 60 minutes prior to,  not following a meal  HPI   Chief complaint : Feels like food is still in her throat when she wakes up in the morning, chronic constipation, sometimes bleeds with bowel movements (for years)  Brief GI History: Patient gives a history of chronic constipation.  She defines constipation as decreased frequency of bowel movements and hard stools.  She has incomplete rectal emptying.  She has tried stool softeners, fiber,  and increased water intake without improvement.  She has tried MiraLAX though not on a daily basis.  Regular use of  MiraLAX causes her stools to be too loose.  She drinks about 4 bottles of water a day, cannot drink much more than that or it causes bloating.  She has been eating some Chia seeds which help the constipation.  Deanna Mckay has had occasional bleeding with bowel movements for years.  She gives a history of hemorrhoids.  She is never used anything to treat the hemorrhoids.  She feels like the bleeding has become a little more pronounced lately.  Additionally, patient feels that she does not digest her food well.  She gets heartburn.  She wakes up in the morning feeling like her food is in her throat.  Started Prilosec a couple of months ago which is helping.  Thy would like to have diagnostic evaluation of constipation.   September 2024 CBC normal Pregnancy test negative CTAP with contrast for abdominal pain -no acute findings.   TSH was normal in June 2024  Labs      Latest Ref Rng & Units 06/03/2023    5:27 PM 03/11/2023    9:25 AM 01/18/2023    5:06 PM  CBC  WBC 4.0 - 10.5 K/uL 6.2  5.1  5.2   Hemoglobin 12.0 - 15.0 g/dL 40.3  47.4  25.9   Hematocrit 36.0 - 46.0 % 37.2  37.5  37.7   Platelets 150 - 400 K/uL 354  348  341     Lab Results  Component Value Date   LIPASE 46 06/03/2023      Latest Ref Rng &  Units 06/03/2023    5:27 PM 03/11/2023    9:25 AM 12/03/2022   10:52 PM  CMP  Glucose 70 - 99 mg/dL 84  86  84   BUN 6 - 20 mg/dL 8  10  10    Creatinine 0.44 - 1.00 mg/dL 1.61  0.96  0.45   Sodium 135 - 145 mmol/L 139  139  141   Potassium 3.5 - 5.1 mmol/L 3.8  4.5  4.1   Chloride 98 - 111 mmol/L 104  103  106   CO2 22 - 32 mmol/L 29  24  31    Calcium 8.9 - 10.3 mg/dL 9.3  8.8  9.2   Total Protein 6.0 - 8.5 g/dL  6.7  7.7   Total Bilirubin 0.0 - 1.2 mg/dL  <4.0  0.3   Alkaline Phos 44 - 121 IU/L  72  51   AST 0 - 40 IU/L  13  15   ALT 0 - 32 IU/L  8  11        CT ABDOMEN PELVIS W CONTRAST CLINICAL DATA:  Left lower quadrant abdominal pain  EXAM: CT ABDOMEN AND  PELVIS WITH CONTRAST  TECHNIQUE: Multidetector CT imaging of the abdomen and pelvis was performed using the standard protocol following bolus administration of intravenous contrast.  RADIATION DOSE REDUCTION: This exam was performed according to the departmental dose-optimization program which includes automated exposure control, adjustment of the mA and/or kV according to patient size and/or use of iterative reconstruction technique.  CONTRAST:  85mL OMNIPAQUE IOHEXOL 300 MG/ML  SOLN  COMPARISON:  01/17/2015  FINDINGS: Lower chest: Lung bases are clear.  Hepatobiliary: Liver is within normal limits, noting focal fat/altered fusion along the falciform ligament.  Gallbladder is unremarkable. No intrahepatic or extrahepatic ductal dilatation.  Pancreas: Within normal limits.  Spleen: Within normal limits.  Adrenals/Urinary Tract: Adrenal glands within normal limits.  Kidneys are within normal limits.  No hydronephrosis.  Bladder is within normal limits.  Stomach/Bowel: Stomach is within normal limits.  No evidence of bowel obstruction.  Normal appendix (series 2/image 81).  No colonic wall thickening or inflammatory changes.  Vascular/Lymphatic: No evidence of abdominal aortic aneurysm.  No suspicious abdominopelvic lymphadenopathy.  Reproductive: Uterus and right ovary are within normal limits.  3.6 cm fat density lesion in the left ovary/adnexa (series 2/image 70), suggesting a benign ovarian dermoid.  Other: Small volume pelvic ascites, likely physiologic.  Musculoskeletal: Visualized osseous structures are within normal limits.  IMPRESSION: No evidence of bowel obstruction. Normal appendix.  3.6 cm fat density lesion in the left ovary/adnexa, suggesting a benign ovarian dermoid. In the setting of left lower quadrant abdominal pain, consider pelvic ultrasound with Doppler for further evaluation as clinically warranted.  Electronically Signed   By:  Charline Bills M.D.   On: 06/03/2023 19:59    Past Medical History:  Diagnosis Date   Acid reflux    Allergy    Anxiety    Asthma    prn inhaler   Bipolar and related disorder (HCC) 12/17/2015   Depression    Dyspepsia    no current med.   Eczema    Herpes 2018   Isosexual precocity    Obesity    Oppositional defiant disorder    Post traumatic stress disorder    Post-operative nausea and vomiting    Seasonal allergies    Past Surgical History:  Procedure Laterality Date   CLOSED REDUCTION AND PERCUTANEOUS PINNING OF HUMERUS FRACTURE Right 10/31/2005  supracondylar humerus fx.   CYST EXCISION Right 07/11/2002   temple area   MINOR SUPPRELIN REMOVAL Left 01/11/2014   Procedure: REMOVAL OF SUPPRELIN IMPLANT IN LEFT UPPER EXTREMITY;  Surgeon: Judie Petit. Leonia Corona, MD;  Location: [facility];  Service: Pediatrics;  Laterality: Left;   MOUTH SURGERY     SUPPRELIN IMPLANT  01/14/2012   Procedure: SUPPRELIN IMPLANT;  Surgeon: Judie Petit. Leonia Corona, MD;  Location: [facility];  Service: Pediatrics;  Laterality: Left;   TOENAIL EXCISION Right 03/19/2008   great toe   Family History  Problem Relation Age of Onset   Stroke Mother    Asthma Mother    Depression Mother    Hypertension Father    Heart disease Father    Asthma Father    Eczema Father    Cancer Maternal Aunt    Cancer Paternal Grandfather    Social History   Tobacco Use   Smoking status: Never   Smokeless tobacco: Never  Vaping Use   Vaping status: Never Used  Substance Use Topics   Alcohol use: Yes    Comment: rarely   Drug use: Not Currently    Types: Marijuana   Current Outpatient Medications  Medication Sig Dispense Refill   albuterol (PROVENTIL HFA;VENTOLIN HFA) 108 (90 Base) MCG/ACT inhaler Inhale 2 puffs into the lungs every 4 (four) hours as needed for wheezing or shortness of breath. 2 Inhaler 1   ARIPiprazole (ABILIFY) 20 MG tablet Take 20 mg by mouth daily.      Cholecalciferol (VITAMIN D3) 50 MCG (2000 UT) capsule Take 1 capsule (2,000 Units total) by mouth daily. 120 capsule 0   EPINEPHrine (EPIPEN 2-PAK) 0.3 mg/0.3 mL IJ SOAJ injection Inject 0.3 mLs (0.3 mg total) into the muscle as needed for anaphylaxis (if you use this, you must call 911 immediately). 1 each 1   hydrocortisone cream 1 % Apply 1 application topically 2 (two) times daily as needed (eczema). 30 g 0   hydrOXYzine (ATARAX) 25 MG tablet Take 12.5 mg by mouth at bedtime as needed.     omeprazole (PRILOSEC) 20 MG capsule TAKE 1 CAPSULE(20 MG) BY MOUTH DAILY 30 capsule 3   Semaglutide-Weight Management (WEGOVY) 0.25 MG/0.5ML SOAJ Inject 0.25 mg into the skin once a week. 2 mL 1   valACYclovir (VALTREX) 500 MG tablet Take 1 tablet (500 mg total) by mouth daily. 30 tablet 1   No current facility-administered medications for this visit.   Allergies  Allergen Reactions   Other Shortness Of Breath and Other (See Comments)    Any type of BEANS exacerbate the patient's asthma   Trichophyton Shortness Of Breath and Other (See Comments)    Wheezing  Wheezing   Midazolam Hcl Nausea And Vomiting   Penicillins Hives and Rash    Has patient had a PCN reaction causing immediate rash, facial/tongue/throat swelling, SOB or lightheadedness with hypotension: Yes Has patient had a PCN reaction causing severe rash involving mucus membranes or skin necrosis: No Has patient had a PCN reaction that required hospitalization: No Has patient had a PCN reaction occurring within the last 10 years: No If all of the above answers are "NO", then may proceed with Cephalosporin use. Has patient had a PCN reaction causing immediate rash, facial/tongue/throat swelling, SOB or lightheadedness with hypotension: Yes Has patient had a PCN reaction causing severe rash involving mucus membranes or skin necrosis: No Has patient had a PCN reaction that required hospitalization: No Has patient had a PCN  reaction occurring  within the last 10 years: No If all of the above answers are "NO", then may proceed with Cephalosporin use.   Soy Allergy (Do Not Select) Other (See Comments)    WHEEZING/EXACERBATES ASTHMA WHEEZING/EXACERBATES ASTHMA   Versed [Midazolam Hcl] Nausea And Vomiting   Ranitidine Hcl Rash   Zantac [Ranitidine Hcl] Rash     Review of Systems: All  systems reviewed and negative except where noted in HPI.   Wt Readings from Last 3 Encounters:  08/30/23 186 lb (84.4 kg)  07/27/23 184 lb 9.6 oz (83.7 kg)  06/21/23 182 lb 9.6 oz (82.8 kg)    Physical Exam:  LMP 08/11/2023 (Exact Date)  Constitutional:  Pleasant, generally well appearing female in no acute distress. Psychiatric:  Normal mood and affect. Behavior is normal. EENT: Pupils normal.  Conjunctivae are normal. No scleral icterus. Neck supple.  Cardiovascular: Normal rate, regular rhythm.  Pulmonary/chest: Effort normal and breath sounds normal. No wheezing, rales or rhonchi. Abdominal: Soft, nondistended, nontender. Bowel sounds active throughout. There are no masses palpable. No hepatomegaly. Rectal exam: Declines Neurological: Alert and oriented to person place and time.  Willette Cluster, NP  09/02/2023, 8:31 AM  Cc:  Referring Provider Pollie Meyer Estevan Ryder, MD

## 2023-09-02 NOTE — Patient Instructions (Addendum)
Take prilosec at least 30 minutes before dinner   We have sent the following medications to your pharmacy for you to pick up at your convenience: Amitiza 1 tablet 2 times a day  You have been scheduled for an appointment with Dr. Adela Lank on 11-19-2023 at 8:10am . Please arrive 10 minutes early for your appointment.   Acid Reflux  Below are some measures you can take to hopefully improve acid reflux symptoms . We may have discussed some of these today in the office. Not everything on this list may apply to you   --If you are taking anti-reflux ( GERD) medication be sure to take it 30 minutes to one hour before breakfast or dinner if you take it in the afternoon.  --Avoid late meals / bedtime snacks.   --Avoid trigger foods ( foods which you know tend to aggravate you reflux symptoms). Some common trigger foods include spicy foods, fatty foods, acidic foods, chocolate and caffeine.  --Elevate the head of bed 6-8 inches on blocks or bricks. If not able to elevate the head of the bed consider purchasing a wedge pillow to sleep on.    --Weight reduction / maintain a healthy BMI ( body mass index) may be help with reflux symptoms  --Sometimes with the above mentioned "lifestyle changes" patients are able to reduce the amount of GERD medications they take. Our goal is to have you on the lowest effective dose of medication  _______________________________________________________  If your blood pressure at your visit was 140/90 or greater, please contact your primary care physician to follow up on this.  _______________________________________________________  If you are age 59 or older, your body mass index should be between 23-30. Your Body mass index is 31.76 kg/m. If this is out of the aforementioned range listed, please consider follow up with your Primary Care Provider.  If you are age 102 or younger, your body mass index should be between 19-25. Your Body mass index is 31.76 kg/m. If this  is out of the aformentioned range listed, please consider follow up with your Primary Care Provider.   ________________________________________________________  The [facility] providers would like to encourage you to use [facility] to communicate with providers for non-urgent requests or questions.  Due to long hold times on the telephone, sending your provider a message by [facility] may be a faster and more efficient way to get a response.  Please allow 48 business hours for a response.  Please remember that this is for non-urgent requests.  _______________________________________________________

## 2023-09-02 NOTE — Progress Notes (Signed)
Agree with assessment and plan as outlined.  

## 2023-09-15 NOTE — L&D Delivery Note (Addendum)
 Delivery Note Deanna Mckay is a 23 y.o. G1P0000 at [redacted]w[redacted]d admitted for IOL for GHTN.   GBS Status:  PRESUMPTIVE NEGATIVE/-- (08/17 0156)  Labor course: Initial SVE: 1/./- Augmentation with: AROM, Pitocin , Cytotec , and IP Foley. She then progressed to complete.  ROM: 24h 7m with clear fluid  Birth: After a 2 hour 2nd stage, she delivered a Live born female  Birth Weight: 6 lb 5.6 oz (2880 g) APGAR: 8, 9  Newborn Delivery   Birth date/time: 05/02/2024 02:19:00 Delivery type: Vaginal, Spontaneous        Delivered via spontaneous vaginal delivery (Presentation: OA ). Nuchal cord present: Yes. Delivered through. Shoulders and body delivered in usual fashion. Infant placed directly on mom's abdomen for bonding/skin-to-skin, baby dried and stimulated. Cord clamped x 2 after 1 minute and cut by FOB.  Cord blood collected. Placenta delivered-Spontaneous with 3 vessels. 20u Pitocin  in 500cc LR given as a bolus prior to delivery of placenta.  Fundus was slow to firm with massage. TXA 1gm given IVPB and cytotec  800mcg PR. LUS cleared of clots, JADA in room, but bleeding normalized. Placenta inspected and appears to be intact with a 3 VC. Sponge and instrument count were correct x2.  Intrapartum complications:  Developed preeclampsia with severe features immediately postpartum (BPs), started on magnesium  sulfate, procadia, lasix  and potassium Anesthesia:  epidural Lacerations:  2nd degree Suture Repair: 3.0 vicryl EBL (mL):675.00    Mom to postpartum.  Baby to Couplet care / Skin to Skin. Placenta to L&D   Plans to Breast and bottlefeed Contraception: condoms Circumcision: wants inpatient  Note sent to [facility]: [facility] for pp visit.  Delivery Report:   Review the Delivery Report for details.     Signed: Cathlean Ely, DNP,CNM 05/02/2024, 3:35 AM

## 2023-09-27 ENCOUNTER — Institutional Professional Consult (permissible substitution): Payer: MEDICAID | Admitting: Obstetrics and Gynecology

## 2023-09-27 ENCOUNTER — Ambulatory Visit: Payer: MEDICAID

## 2023-09-27 DIAGNOSIS — N912 Amenorrhea, unspecified: Secondary | ICD-10-CM | POA: Diagnosis not present

## 2023-09-27 DIAGNOSIS — Z3200 Encounter for pregnancy test, result unknown: Secondary | ICD-10-CM

## 2023-09-27 DIAGNOSIS — Z3201 Encounter for pregnancy test, result positive: Secondary | ICD-10-CM

## 2023-09-27 LAB — POCT URINE PREGNANCY: Preg Test, Ur: POSITIVE — AB

## 2023-09-27 NOTE — Progress Notes (Signed)
 Deanna Mckay here for a UPT. Pt had a positive upt at home. LMP is 08/11/23.     UPT in office Positive.    Reviewed medications and informed to start a PNV, if not already. Pt to follow up in 3-4 weeks for New OB visit.

## 2023-10-13 ENCOUNTER — Ambulatory Visit: Payer: MEDICAID | Admitting: *Deleted

## 2023-10-13 ENCOUNTER — Other Ambulatory Visit (HOSPITAL_COMMUNITY)
Admission: RE | Admit: 2023-10-13 | Discharge: 2023-10-13 | Disposition: A | Discharge status: Home / Self Care | Payer: MEDICAID | Source: Ambulatory Visit | Attending: Obstetrics & Gynecology | Admitting: Obstetrics & Gynecology

## 2023-10-13 ENCOUNTER — Other Ambulatory Visit (INDEPENDENT_AMBULATORY_CARE_PROVIDER_SITE_OTHER): Payer: MEDICAID

## 2023-10-13 VITALS — BP 124/83 | HR 71 | Wt 185.4 lb

## 2023-10-13 DIAGNOSIS — Z1339 Encounter for screening examination for other mental health and behavioral disorders: Secondary | ICD-10-CM | POA: Diagnosis not present

## 2023-10-13 DIAGNOSIS — Z3A08 8 weeks gestation of pregnancy: Secondary | ICD-10-CM

## 2023-10-13 DIAGNOSIS — O0992 Supervision of high risk pregnancy, unspecified, second trimester: Secondary | ICD-10-CM | POA: Insufficient documentation

## 2023-10-13 DIAGNOSIS — Z3401 Encounter for supervision of normal first pregnancy, first trimester: Secondary | ICD-10-CM | POA: Diagnosis not present

## 2023-10-13 DIAGNOSIS — O3680X0 Pregnancy with inconclusive fetal viability, not applicable or unspecified: Secondary | ICD-10-CM

## 2023-10-13 DIAGNOSIS — Z3480 Encounter for supervision of other normal pregnancy, unspecified trimester: Secondary | ICD-10-CM

## 2023-10-13 MED ORDER — BLOOD PRESSURE KIT DEVI
1.0000 | 0 refills | Status: AC
Start: 2023-10-13 — End: ?

## 2023-10-13 NOTE — Patient Instructions (Signed)
[facility] [employer] has a partnership with the [employer] to provide prenatal navigation for the most needed resources in our community. In order to see how we can help connect you to these resources we need consent to contact you. Please complete the very short consent using the link below:   English Link: https://[place].tfaforms.net/283?site=16  Spanish Link: https://[place].tfaforms.net/287?site=16  Our practice his participating in a study that provides no-cost doula care. ACURE4Moms is a study looking at how doula care can reduce birthing disparities for Black and brown birthing people. We like to refer patients as soon as possible, but definitely before 28 weeks so patients can get to know their doula.    A doula is trained to provide support before, during and just after you give birth. While doulas do not provide medical care, they do provide emotional, physical and educational support. Doulas can help reduce your stress and comfort you and your partner. They can help you cope with labor by helping you use breathing techniques, massage, creative labor positioning, essential oils and affirmations.   ACURE4Moms is a research study trying to reduce:   low birthweight babies  emergency department visits & hospitalizations for birthing persons and their babies  depression among birthing people  discrimination in pregnancy-related care ACURE4Moms is trying out 2 programs designed by  people who have given birth. These programs include: 1. Sharing patient data and warning alerts with clinic staff to keep them accountable for their patients' outcomes and providing tools to help them  reduce bias in care. 2. Matching eligible patients with doulas from the  same community as the patients.  If you would like to participate in this study, please visit:   http://carroll-castaneda.info/  Options for Doula Care in the [place] Area  As you review  your birthing options, consider having a birth doula. A doula is trained to provide support before, during and just after you give birth. There are also postpartum doulas that help you adjust to new parenthood.  While doulas do not provide medical care, they do provide emotional, physical and educational support. A few months before your baby arrives, doulas can help answer questions, ease concerns and help you create and support your birthing plan.    Doulas can help reduce your stress and comfort you and your partner. They can help you cope with labor by helping you use breathing techniques, massage, creative labor positioning, essential oils and affirmations.   Studies show that the benefits of having a doula include:   A more positive birth experience  Fewer requests for pain-relief medication  Less likelihood of cesarean section, commonly called a c-section   Doulas are typically hired via a [employer] account planner between you and the doula. We are happy to provide a list of the most active doulas in the area, all of whom are credentialed by [facility] and will not count as a visitor at your birth.  There are several options for no-cost doula care at our hospital, including:  [facility] Volunteer Doula Program [employer] Program A Cure 4 Moms Doula Study (available only at [employer] [facility], [place], [place] and [employer] [facility] offices)  For more information on these programs or to receive a list of doulas active in our area, please email [email or web address]

## 2023-10-13 NOTE — Progress Notes (Signed)
New OB Intake  I connected with Deanna Mckay  on 10/13/23 at  1:10 PM EST by In Person Visit and verified that I am speaking with the correct person using two identifiers. Nurse is located at [facility] and pt is located at [place].  I discussed the limitations, risks, security and privacy concerns of performing an evaluation and management service by telephone and the availability of in person appointments. I also discussed with the patient that there may be a patient responsible charge related to this service. The patient expressed understanding and agreed to proceed.  I explained I am completing New OB Intake today. We discussed EDD of 05/17/2024, by Last Menstrual Period. Pt is G1P0000. I reviewed her allergies, medications and Medical/Surgical/OB history.    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   Contraception management 08/28/2019   BRBPR (bright red blood per rectum) 07/19/2019   Weight loss 02/24/2019   Vitamin D deficiency 02/24/2019   Genital herpes 11/18/2018   Irregular menses 02/22/2017   Insomnia 03/04/2016   Bipolar and related disorder (HCC) 12/17/2015   GERD (gastroesophageal reflux disease) 08/18/2015   Acne 06/14/2015   Decreased visual acuity 02/27/2015   PTSD (post-traumatic stress disorder) 02/02/2015   Generalized anxiety disorder 01/29/2015   Major depression, recurrent (HCC) 01/29/2015   Abdominal pain 01/17/2015   Eczema 07/11/2012   Soy allergy 04/29/2012   Dysphagia 04/21/2012   Allergic rhinitis 03/24/2012   Chronic constipation 03/24/2012   Oppositional defiant disorder 12/24/2011   Goiter 12/14/2011   Obesity    Acanthosis nigricans, acquired    Asthma     Concerns addressed today  Delivery  Plans Plans to deliver at [facility]. Discussed the nature of our practice with multiple providers including residents and students. Due to the size of the practice, the delivering provider may not be the same as those providing prenatal care.   Patient is interested in water birth. Offered upcoming OB visit with CNM to discuss further.  MyChart/Babyscripts MyChart access verified. I explained pt will have some visits in office and some virtually. Babyscripts instructions given and order placed. Patient verifies receipt of registration text/e-mail. Account successfully created and app downloaded. If patient is a candidate for Optimized scheduling, add to sticky note.   Blood Pressure Cuff/Weight Scale Blood pressure cuff ordered for patient to pick-up from [employer]. Explained after first prenatal appt pt will check weekly and document in Babyscripts. Patient does not have weight scale; patient may purchase if they desire to track weight weekly in Babyscripts.  Anatomy US Explained first scheduled [place] will be around 19 weeks. Anatomy US scheduled for TBD at TBD.  Interested in [place]? If yes, send referral and doula dot phrase.   Is patient a candidate for Babyscripts Optimization? Yes, patient accepted    First visit review I reviewed new OB appt with patient. Explained pt will be seen by Dr. Clearance Coots at first visit. Discussed Avelina Laine genetic screening with patient. Requests Panorama and Horizon. Routine prenatal labs  OB Urine and GC/CC only collected at today's visit. Initial OB labs deferred to New OB.    Last Pap Diagnosis  Date Value Ref Range Status  01/18/2023   Final   - Negative for intraepithelial lesion or  malignancy (NILM)    Harrel Lemon, RN 10/13/2023  1:15 PM

## 2023-10-14 LAB — CERVICOVAGINAL ANCILLARY ONLY
Chlamydia: NEGATIVE
Comment: NEGATIVE
Comment: NORMAL
Neisseria Gonorrhea: NEGATIVE

## 2023-11-09 ENCOUNTER — Ambulatory Visit: Payer: MEDICAID | Admitting: Obstetrics

## 2023-11-09 ENCOUNTER — Other Ambulatory Visit (HOSPITAL_COMMUNITY)
Admission: RE | Admit: 2023-11-09 | Discharge: 2023-11-09 | Disposition: A | Discharge status: Home / Self Care | Payer: MEDICAID | Source: Ambulatory Visit | Attending: Obstetrics | Admitting: Obstetrics

## 2023-11-09 VITALS — BP 114/83 | HR 81 | Wt 189.0 lb

## 2023-11-09 DIAGNOSIS — Z3A12 12 weeks gestation of pregnancy: Secondary | ICD-10-CM

## 2023-11-09 DIAGNOSIS — Z2839 Other underimmunization status: Secondary | ICD-10-CM

## 2023-11-09 DIAGNOSIS — O09891 Supervision of other high risk pregnancies, first trimester: Secondary | ICD-10-CM

## 2023-11-09 DIAGNOSIS — D271 Benign neoplasm of left ovary: Secondary | ICD-10-CM

## 2023-11-09 DIAGNOSIS — Z3480 Encounter for supervision of other normal pregnancy, unspecified trimester: Secondary | ICD-10-CM | POA: Insufficient documentation

## 2023-11-09 MED ORDER — VITAFOL ULTRA 29-0.6-0.4-200 MG PO CAPS
1.0000 | ORAL_CAPSULE | Freq: Every day | ORAL | 4 refills | Status: DC
Start: 2023-11-09 — End: 2024-05-04

## 2023-11-09 NOTE — Progress Notes (Signed)
 Subjective:    Deanna Mckay is being seen today for her first obstetrical visit.  This is a planned pregnancy. She is at [redacted]w[redacted]d gestation. Her obstetrical history is significant for  none . Relationship with FOB: significant other, not living together. Patient does intend to breast feed. Pregnancy history fully reviewed.  The information documented in the HPI was reviewed and verified.  Menstrual History: OB History     Gravida  1   Para  0   Term  0   Preterm  0   AB  0   Living  0      SAB  0   IAB  0   Ectopic  0   Multiple  0   Live Births  0            Patient's last menstrual period was 08/11/2023 (exact date).    Past Medical History:  Diagnosis Date   Acid reflux    Allergy    Anxiety    Asthma    prn inhaler   Bipolar and related disorder (HCC) 12/17/2015   Cyst of ovary, left    Depression    Dyspepsia    no current med.   Eczema    Herpes 2018   Isosexual precocity    Obesity    Oppositional defiant disorder    Post traumatic stress disorder    Post-operative nausea and vomiting    Seasonal allergies     Past Surgical History:  Procedure Laterality Date   CLOSED REDUCTION AND PERCUTANEOUS PINNING OF HUMERUS FRACTURE Right 10/31/2005   supracondylar humerus fx.   CYST EXCISION Right 07/11/2002   temple area   MINOR SUPPRELIN REMOVAL Left 01/11/2014   Procedure: REMOVAL OF SUPPRELIN IMPLANT IN LEFT UPPER EXTREMITY;  Surgeon: Judie Petit. Leonia Corona, MD;  Location: [facility];  Service: Pediatrics;  Laterality: Left;   MOUTH SURGERY     SUPPRELIN IMPLANT  01/14/2012   Procedure: SUPPRELIN IMPLANT;  Surgeon: Judie Petit. Leonia Corona, MD;  Location: [facility];  Service: Pediatrics;  Laterality: Left;   TOENAIL EXCISION Right 03/19/2008   great toe    (Not in a hospital admission)  Allergies  Allergen Reactions   Other Shortness Of Breath and Other (See Comments)    Any type of BEANS exacerbate the patient's asthma    Trichophyton Shortness Of Breath and Other (See Comments)    Wheezing  Wheezing   Midazolam Hcl Nausea And Vomiting   Penicillins Hives and Rash    Has patient had a PCN reaction causing immediate rash, facial/tongue/throat swelling, SOB or lightheadedness with hypotension: Yes Has patient had a PCN reaction causing severe rash involving mucus membranes or skin necrosis: No Has patient had a PCN reaction that required hospitalization: No Has patient had a PCN reaction occurring within the last 10 years: No If all of the above answers are "NO", then may proceed with Cephalosporin use. Has patient had a PCN reaction causing immediate rash, facial/tongue/throat swelling, SOB or lightheadedness with hypotension: Yes Has patient had a PCN reaction causing severe rash involving mucus membranes or skin necrosis: No Has patient had a PCN reaction that required hospitalization: No Has patient had a PCN reaction occurring within the last 10 years: No If all of the above answers are "NO", then may proceed with Cephalosporin use.   Soy Allergy (Obsolete) Other (See Comments)    WHEEZING/EXACERBATES ASTHMA WHEEZING/EXACERBATES ASTHMA   Versed [Midazolam Hcl] Nausea And Vomiting  Ranitidine Hcl Rash   Zantac [Ranitidine Hcl] Rash    Social History   Tobacco Use   Smoking status: Never   Smokeless tobacco: Never  Substance Use Topics   Alcohol use: Not Currently    Comment: rarely will do wine    Family History  Problem Relation Age of Onset   Stroke Mother    Asthma Mother    Depression Mother    Hypertension Father    Heart disease Father    Asthma Father    Eczema Father    Cancer Maternal Aunt    Cancer Paternal Grandfather      Review of Systems Constitutional: negative for weight loss Gastrointestinal: negative for vomiting Genitourinary:negative for genital lesions and vaginal discharge and dysuria Musculoskeletal:negative for back pain Behavioral/Psych: negative for  abusive relationship, depression, illegal drug usage and tobacco use    Objective:    BP 114/83   Pulse 81   Wt 189 lb (85.7 kg)   LMP 08/11/2023 (Exact Date)   BMI 32.44 kg/m  General Appearance:    Alert, cooperative, no distress, appears stated age  Head:    Normocephalic, without obvious abnormality, atraumatic  Eyes:    PERRL, conjunctiva/corneas clear, EOM's intact, fundi    benign, both eyes  Ears:    Normal TM's and external ear canals, both ears  Nose:   Nares normal, septum midline, mucosa normal, no drainage    or sinus tenderness  Throat:   Lips, mucosa, and tongue normal; teeth and gums normal  Neck:   Supple, symmetrical, trachea midline, no adenopathy;    thyroid:  no enlargement/tenderness/nodules; no carotid   bruit or JVD  Back:     Symmetric, no curvature, ROM normal, no CVA tenderness  Lungs:     Clear to auscultation bilaterally, respirations unlabored  Chest Wall:    No tenderness or deformity   Heart:    Regular rate and rhythm, S1 and S2 normal, no murmur, rub   or gallop  Breast Exam:    No tenderness, masses, or nipple abnormality  Abdomen:     Soft, non-tender, bowel sounds active all four quadrants,    no masses, no organomegaly  Genitalia:    Normal female without lesion, discharge or tenderness  Extremities:   Extremities normal, atraumatic, no cyanosis or edema  Pulses:   2+ and symmetric all extremities  Skin:   Skin color, texture, turgor normal, no rashes or lesions  Lymph nodes:   Cervical, supraclavicular, and axillary nodes normal  Neurologic:   CNII-XII intact, normal strength, sensation and reflexes    throughout      Lab Review Urine pregnancy test Labs reviewed yes Radiologic studies reviewed no    US PELVIC COMPLETE WITH TRANSVAGINAL (Accession [id number]) (Order [id number]) Imaging Date: 09/02/2023 Department: [facility] Released By: Kathi Simpers Authorizing: Catalina Antigua, MD   Exam  Status  Status  Final [99]   PACS Intelerad Image Link   Show images for US PELVIC COMPLETE WITH TRANSVAGINAL Study Result  Narrative & Impression  CLINICAL DATA:  Follow-up CT   EXAM: TRANSABDOMINAL AND TRANSVAGINAL ULTRASOUND OF PELVIS   TECHNIQUE: Both transabdominal and transvaginal ultrasound examinations of the pelvis were performed. Transabdominal technique was performed for global imaging of the pelvis including uterus, ovaries, adnexal regions, and pelvic cul-de-sac. It was necessary to proceed with endovaginal exam following the transabdominal exam to visualize the adnexal structures.   COMPARISON:  CT abdomen pelvis 06/03/2023   FINDINGS: Uterus  Measurements: 6.2 x 3.2 x 3.9 cm = volume: 40.8 mL. No fibroids or other mass visualized.   Endometrium   Thickness: 14.0 mm.  No focal abnormality visualized.   Right ovary   Measurements: 3.3 x 3.3 x 3.7 cm = volume: 21.0 mL. Normal appearance/no adnexal mass.   Left ovary   Measurements: 4.6 x 4.3 x 4.9 cm = volume: 30.0 mL. There is a 3.6 x 4.0 x 3.8 cm echogenic mass in the left adnexa.   Other findings   Trace fluid in the pelvis.   IMPRESSION: 1. There is a 4 cm echogenic mass in the left adnexa. This may represent a dermoid. 2. Trace fluid in the pelvis.     Electronically Signed   By: Annia Belt M.D.   On: 09/03/2023 18:08      Assessment:   Pregnancy at [redacted]w[redacted]d weeks    Plan:    1. Supervision of other normal pregnancy, antepartum (Primary) Rx: - Culture, OB Urine - Cervicovaginal ancillary only - CBC/D/Plt+RPR+Rh+ABO+RubIgG... - PANORAMA PRENATAL TEST - HORIZON Basic Panel - Prenat-Fe Poly-Methfol-FA-DHA (VITAFOL ULTRA) 29-0.6-0.4-200 MG CAPS; Take 1 capsule by mouth daily before breakfast.  Dispense: 90 capsule; Refill: 4  2. Dermoid cyst of left ovary, small  - follow clinically and with follow up ultrasound  Prenatal vitamins.  Counseling provided regarding continued use  of seat belts, cessation of alcohol consumption, smoking or use of illicit drugs; infection precautions i.e., influenza/TDAP immunizations, toxoplasmosis,CMV, parvovirus, listeria and varicella; workplace safety, exercise during pregnancy; routine dental care, safe medications, sexual activity, hot tubs, saunas, pools, travel, caffeine use, fish and methlymercury, potential toxins, hair treatments, varicose veins Weight gain recommendations per IOM guidelines reviewed: underweight/BMI< 18.5--> gain 28 - 40 lbs; normal weight/BMI 18.5 - 24.9--> gain 25 - 35 lbs; overweight/BMI 25 - 29.9--> gain 15 - 25 lbs; obese/BMI >30->gain  11 - 20 lbs Problem list reviewed and updated. FIRST/CF mutation testing/NIPT/QUAD SCREEN/fragile X/Ashkenazi Jewish population testing/Spinal muscular atrophy discussed: requested. Role of ultrasound in pregnancy discussed; fetal survey: requested. Amniocentesis discussed: not indicated.  Meds ordered this encounter  Medications   Prenat-Fe Poly-Methfol-FA-DHA (VITAFOL ULTRA) 29-0.6-0.4-200 MG CAPS    Sig: Take 1 capsule by mouth daily before breakfast.    Dispense:  90 capsule    Refill:  4   Orders Placed This Encounter  Procedures   Culture, OB Urine   CBC/D/Plt+RPR+Rh+ABO+RubIgG...    Release to patient:   Immediate   PANORAMA PRENATAL TEST    ==========Department Information========== ID: [id number] Department:[facility] AT [facility] [address] [place] [address] Dept: [phone] Dept Fax: [phone]     Method/type of collection::   Clinic to manage sample collection    Expected due date (MM/DD/YYYY)::   05/17/2024    Is this a twin pregnancy? (viable, no vanished twin):   Not Twin Pregnancy, Mason Jim    Is this a surrogate or egg donor pregnancy?:   No    I want fetal sex included in the report::   Yes    Maternal Weight (lbs)::   189    Which Microdeletion  Panel should be ordered?:   22q11.2 Deletion    What type of billing?:   Automatic Data    By placing this electronic order I confirm the testing ordered herein is medically necessary and this patient has been informed of the  details of the genetic test(s) ordered, including the risks, benefits, and alternatives, and has consented to testing.:   Yes    Select an order diagnosis: For additional options refer to [email or web address].[email or web address]:   Encounter for supervision of other normal pregnancy in second trimester [1509691]   HORIZON Basic Panel    ==========Department Information========== ID: [id number] Department:[facility] AT [facility] [address], [address] [place] [address] Dept: [phone] Dept Fax: [phone]     Specify the name or ID of a valid Horizon Custom Panel::   HBASIC    Is patient pregnant?:   Yes    Practice ensures that HIPAA consent is obtained and will make available to [facility] upon request?:   Yes    By placing this electronic order I confirm the testing ordered herein is medically necessary and this patient has been informed of the details of the genetic test(s) ordered, including the risks, benefits, and alternatives, and has consented to testing.:   Yes    What type of billing?:   [employer] an order diagnosis: For additional options refer to [email or web address].[email or web address]:   Encounter of female for testing for genetic disease carrier status for procreative management [phone]    Tay-Sachs add-on test?:   No    Follow up in 4 weeks.  I have spent a total of 30 minutes of face-to-face time, excluding clinical staff time, reviewing notes and preparing to see patient, ordering tests and/or medications, and counseling the patient.   Brock Bad, MD, FACOG Attending Obstetrician & Gynecologist, [facility] for  [facility], [facility], [place] 11/09/2023

## 2023-11-09 NOTE — Progress Notes (Addendum)
 New OB OB Intake completed Reports low abd pressure and vaginal/ urinary burning Reports green vag dc Needs OB Labs

## 2023-11-10 ENCOUNTER — Encounter: Payer: Self-pay | Admitting: Family Medicine

## 2023-11-10 DIAGNOSIS — Z2839 Other underimmunization status: Secondary | ICD-10-CM | POA: Insufficient documentation

## 2023-11-10 LAB — CERVICOVAGINAL ANCILLARY ONLY
Chlamydia: NEGATIVE
Comment: NEGATIVE
Comment: NORMAL
Neisseria Gonorrhea: NEGATIVE

## 2023-11-10 LAB — CBC/D/PLT+RPR+RH+ABO+RUBIGG...
Antibody Screen: NEGATIVE
Basophils Absolute: 0 10*3/uL (ref 0.0–0.2)
Basos: 0 %
EOS (ABSOLUTE): 0 10*3/uL (ref 0.0–0.4)
Eos: 0 %
HCV Ab: NONREACTIVE
HIV Screen 4th Generation wRfx: NONREACTIVE
Hematocrit: 34.4 % (ref 34.0–46.6)
Hemoglobin: 11.8 g/dL (ref 11.1–15.9)
Hepatitis B Surface Ag: NEGATIVE
Immature Grans (Abs): 0 10*3/uL (ref 0.0–0.1)
Immature Granulocytes: 0 %
Lymphocytes Absolute: 1.2 10*3/uL (ref 0.7–3.1)
Lymphs: 24 %
MCH: 31.9 pg (ref 26.6–33.0)
MCHC: 34.3 g/dL (ref 31.5–35.7)
MCV: 93 fL (ref 79–97)
Monocytes Absolute: 0.4 10*3/uL (ref 0.1–0.9)
Monocytes: 8 %
Neutrophils Absolute: 3.5 10*3/uL (ref 1.4–7.0)
Neutrophils: 68 %
Platelets: 312 10*3/uL (ref 150–450)
RBC: 3.7 x10E6/uL — ABNORMAL LOW (ref 3.77–5.28)
RDW: 13 % (ref 11.7–15.4)
RPR Ser Ql: NONREACTIVE
Rh Factor: POSITIVE
Rubella Antibodies, IGG: 0.96 {index} — ABNORMAL LOW (ref >0.99)
WBC: 5.2 10*3/uL (ref 3.4–10.8)

## 2023-11-10 LAB — HCV INTERPRETATION

## 2023-11-11 LAB — CULTURE, OB URINE

## 2023-11-11 LAB — URINE CULTURE, OB REFLEX

## 2023-11-14 LAB — PANORAMA PRENATAL TEST FULL PANEL:PANORAMA TEST PLUS 5 ADDITIONAL MICRODELETIONS: FETAL FRACTION: 5.8

## 2023-11-18 LAB — HORIZON CUSTOM: REPORT SUMMARY: NEGATIVE

## 2023-11-19 ENCOUNTER — Ambulatory Visit: Payer: MEDICAID | Admitting: Gastroenterology

## 2023-12-07 ENCOUNTER — Encounter: Payer: Self-pay | Admitting: Obstetrics

## 2023-12-07 ENCOUNTER — Ambulatory Visit (INDEPENDENT_AMBULATORY_CARE_PROVIDER_SITE_OTHER): Payer: MEDICAID | Admitting: Obstetrics

## 2023-12-07 VITALS — BP 119/79 | HR 83 | Wt 192.1 lb

## 2023-12-07 DIAGNOSIS — Z3480 Encounter for supervision of other normal pregnancy, unspecified trimester: Secondary | ICD-10-CM | POA: Diagnosis not present

## 2023-12-07 DIAGNOSIS — O09899 Supervision of other high risk pregnancies, unspecified trimester: Secondary | ICD-10-CM | POA: Diagnosis not present

## 2023-12-07 DIAGNOSIS — R519 Headache, unspecified: Secondary | ICD-10-CM | POA: Diagnosis not present

## 2023-12-07 DIAGNOSIS — D271 Benign neoplasm of left ovary: Secondary | ICD-10-CM

## 2023-12-07 DIAGNOSIS — J301 Allergic rhinitis due to pollen: Secondary | ICD-10-CM

## 2023-12-07 DIAGNOSIS — Z2839 Other underimmunization status: Secondary | ICD-10-CM

## 2023-12-07 MED ORDER — LORATADINE 10 MG PO TABS: 10.0000 mg | ORAL_TABLET | Freq: Every day | ORAL | 11 refills | Status: DC

## 2023-12-07 NOTE — Progress Notes (Signed)
 Subjective:  Deanna Mckay is a 23 y.o. G1P0000 at [redacted]w[redacted]d being seen today for ongoing prenatal care.  She is currently monitored for the following issues for this low-risk pregnancy and has Obesity; Acanthosis nigricans, acquired; Asthma; Goiter; Oppositional defiant disorder; Allergic rhinitis; Chronic constipation; Dysphagia; Soy allergy; Eczema; Abdominal pain; Generalized anxiety disorder; Major depression, recurrent (HCC); PTSD (post-traumatic stress disorder); Decreased visual acuity; Acne; GERD (gastroesophageal reflux disease); Bipolar and related disorder (HCC); Insomnia; Irregular menses; Genital herpes; Weight loss; Vitamin D deficiency; BRBPR (bright red blood per rectum); Contraception management; Vaginismus; Dyspareunia, female; Dyspnea on exertion; Abnormal bowel movement; Sore throat; Routine adult health maintenance; Dermoid cyst of left ovary; Supervision of other normal pregnancy, antepartum; and Rubella non-immune status, antepartum on their problem list.  Patient reports headache and rhinitis .  Contractions: Not present. Vag. Bleeding: None.  Movement: Absent. Denies leaking of fluid.   The following portions of the patient's history were reviewed and updated as appropriate: allergies, current medications, past family history, past medical history, past social history, past surgical history and problem list. Problem list updated.  Objective:   Vitals:   12/07/23 1427  BP: 119/79  Pulse: 83  Weight: 192 lb 1.6 oz (87.1 kg)    Fetal Status: Fetal Heart Rate (bpm): 145   Movement: Absent     General:  Alert, oriented and cooperative. Patient is in no acute distress.  Skin: Skin is warm and dry. No rash noted.   Cardiovascular: Normal heart rate noted  Respiratory: Normal respiratory effort, no problems with respiration noted  Abdomen: Soft, gravid, appropriate for gestational age. Pain/Pressure: Absent     Pelvic:  Cervical exam deferred        Extremities: Normal range  of motion.  Edema: Trace (feet and hands)  Mental Status: Normal mood and affect. Normal behavior. Normal judgment and thought content.   Urinalysis:      Assessment and Plan:  Pregnancy: G1P0000 at [redacted]w[redacted]d  1. Supervision of other normal pregnancy, antepartum (Primary) Rx: - AFP, Serum, Open Spina Bifida  2. Dermoid cyst of left ovary, small - will follow  3. Rubella non-immune status, antepartum - rubella vaccination postpartum  4. New onset of headaches - Tylenol prn  5. Seasonal allergic rhinitis due to pollen Rx: - loratadine (CLARITIN) 10 MG tablet; Take 1 tablet (10 mg total) by mouth daily.  Dispense: 30 tablet; Refill: 11    Preterm labor symptoms and general obstetric precautions including but not limited to vaginal bleeding, contractions, leaking of fluid and fetal movement were reviewed in detail with the patient. Please refer to After Visit Summary for other counseling recommendations.   Return in about 4 weeks (around 01/04/2024) for ROB.   Brock Bad, MD 12/07/2023

## 2023-12-07 NOTE — Progress Notes (Signed)
 Pt presents for rob. Pt complains of headaches.

## 2023-12-09 LAB — AFP, SERUM, OPEN SPINA BIFIDA
AFP MoM: 1.16
AFP Value: 36.1 ng/mL
Gest. Age on Collection Date: 16 wk
Maternal Age At EDD: 22.5 a
OSBR Risk 1 IN: 10000
Test Results:: NEGATIVE
Weight: 192 [lb_av]

## 2023-12-16 DIAGNOSIS — O99210 Obesity complicating pregnancy, unspecified trimester: Secondary | ICD-10-CM | POA: Insufficient documentation

## 2023-12-20 ENCOUNTER — Ambulatory Visit: Payer: MEDICAID | Attending: Obstetrics and Gynecology

## 2023-12-20 DIAGNOSIS — Z6831 Body mass index (BMI) 31.0-31.9, adult: Secondary | ICD-10-CM

## 2023-12-20 DIAGNOSIS — Z3480 Encounter for supervision of other normal pregnancy, unspecified trimester: Secondary | ICD-10-CM

## 2023-12-20 DIAGNOSIS — E66811 Obesity, class 1: Secondary | ICD-10-CM

## 2023-12-20 DIAGNOSIS — Z3A19 19 weeks gestation of pregnancy: Secondary | ICD-10-CM

## 2023-12-20 NOTE — Progress Notes (Signed)
 New OB Intake  I connected with Deanna Mckay  on 12/20/23 at 1:40 PM by phone and verified that I am speaking with the correct person using two identifiers. Nurse is located at [facility] and pt is located at home.   I explained I am completing New Patient Intake today. We discussed EDD of 05/17/2024, by Last Menstrual Period. Pt is G1P0000. I reviewed her allergies, medications and Medical/Surgical/OB history.    Patient Active Problem List   Diagnosis Date Noted   Obesity affecting pregnancy, antepartum 12/16/2023   Rubella non-immune status, antepartum 11/10/2023   Supervision of other normal pregnancy, antepartum 10/13/2023   Dermoid cyst of left ovary 06/12/2023   Contraception management 08/28/2019   Weight loss 02/24/2019   Genital herpes 11/18/2018   Bipolar and related disorder (HCC) 12/17/2015   GERD (gastroesophageal reflux disease) 08/18/2015   Decreased visual acuity 02/27/2015   Generalized anxiety disorder 01/29/2015   Major depression, recurrent (HCC) 01/29/2015   Soy allergy 04/29/2012   Oppositional defiant disorder 12/24/2011   Obesity    Asthma     Patient advised of first appointment in our office and when to arrive.   All questions were answered.

## 2023-12-22 ENCOUNTER — Ambulatory Visit: Payer: MEDICAID | Attending: Obstetrics & Gynecology

## 2023-12-22 ENCOUNTER — Other Ambulatory Visit: Payer: Self-pay | Admitting: *Deleted

## 2023-12-22 ENCOUNTER — Ambulatory Visit: Payer: MEDICAID | Admitting: Maternal & Fetal Medicine

## 2023-12-22 ENCOUNTER — Ambulatory Visit: Payer: MEDICAID

## 2023-12-22 ENCOUNTER — Other Ambulatory Visit: Payer: MEDICAID

## 2023-12-22 ENCOUNTER — Encounter: Payer: Self-pay | Admitting: Maternal & Fetal Medicine

## 2023-12-22 VITALS — BP 123/77 | HR 78

## 2023-12-22 DIAGNOSIS — F319 Bipolar disorder, unspecified: Secondary | ICD-10-CM | POA: Diagnosis not present

## 2023-12-22 DIAGNOSIS — Z3689 Encounter for other specified antenatal screening: Secondary | ICD-10-CM

## 2023-12-22 DIAGNOSIS — Z363 Encounter for antenatal screening for malformations: Secondary | ICD-10-CM | POA: Insufficient documentation

## 2023-12-22 DIAGNOSIS — Z3A19 19 weeks gestation of pregnancy: Secondary | ICD-10-CM

## 2023-12-22 DIAGNOSIS — O99212 Obesity complicating pregnancy, second trimester: Secondary | ICD-10-CM | POA: Insufficient documentation

## 2023-12-22 DIAGNOSIS — O35BXX0 Maternal care for other (suspected) fetal abnormality and damage, fetal cardiac anomalies, not applicable or unspecified: Secondary | ICD-10-CM

## 2023-12-22 DIAGNOSIS — D279 Benign neoplasm of unspecified ovary: Secondary | ICD-10-CM | POA: Diagnosis not present

## 2023-12-22 DIAGNOSIS — A6000 Herpesviral infection of urogenital system, unspecified: Secondary | ICD-10-CM | POA: Insufficient documentation

## 2023-12-22 DIAGNOSIS — Z7722 Contact with and (suspected) exposure to environmental tobacco smoke (acute) (chronic): Secondary | ICD-10-CM | POA: Diagnosis not present

## 2023-12-22 DIAGNOSIS — Z3480 Encounter for supervision of other normal pregnancy, unspecified trimester: Secondary | ICD-10-CM | POA: Insufficient documentation

## 2023-12-22 DIAGNOSIS — F419 Anxiety disorder, unspecified: Secondary | ICD-10-CM | POA: Insufficient documentation

## 2023-12-22 DIAGNOSIS — O35EXX0 Maternal care for other (suspected) fetal abnormality and damage, fetal genitourinary anomalies, not applicable or unspecified: Secondary | ICD-10-CM | POA: Insufficient documentation

## 2023-12-22 DIAGNOSIS — O98512 Other viral diseases complicating pregnancy, second trimester: Secondary | ICD-10-CM

## 2023-12-22 DIAGNOSIS — O99210 Obesity complicating pregnancy, unspecified trimester: Secondary | ICD-10-CM | POA: Diagnosis present

## 2023-12-22 DIAGNOSIS — B009 Herpesviral infection, unspecified: Secondary | ICD-10-CM | POA: Diagnosis not present

## 2023-12-22 DIAGNOSIS — O99342 Other mental disorders complicating pregnancy, second trimester: Secondary | ICD-10-CM | POA: Insufficient documentation

## 2023-12-22 DIAGNOSIS — Z3A18 18 weeks gestation of pregnancy: Secondary | ICD-10-CM | POA: Diagnosis not present

## 2023-12-22 DIAGNOSIS — D271 Benign neoplasm of left ovary: Secondary | ICD-10-CM

## 2023-12-22 DIAGNOSIS — E669 Obesity, unspecified: Secondary | ICD-10-CM

## 2023-12-22 DIAGNOSIS — O3482 Maternal care for other abnormalities of pelvic organs, second trimester: Secondary | ICD-10-CM | POA: Insufficient documentation

## 2023-12-22 NOTE — Progress Notes (Signed)
 Patient information  Patient Name: Deanna Mckay  Patient MRN:   [id number]  Referring practice: [facility] Referring Provider: [place] [facility]  MFM CONSULT  Deanna Mckay is a 23 y.o. G1P0000 at [redacted]w[redacted]d here for ultrasound and consultation. Patient Active Problem List   Diagnosis Date Noted   Echogenic focus of heart of fetus affecting antepartum care of mother, single gestation 12/22/2023   Fetal renal anomaly, single gestation (mild UTD) 12/22/2023   Obesity affecting pregnancy, antepartum 12/16/2023   Rubella non-immune status, antepartum 11/10/2023   Supervision of high risk pregnancy, antepartum, second trimester 10/13/2023   Dermoid cyst of left ovary 06/12/2023   Genital herpes 11/18/2018   GERD (gastroesophageal reflux disease) 08/18/2015   Generalized anxiety disorder 01/29/2015   Major depression, recurrent (HCC) 01/29/2015   Obesity    Asthma     Deanna Mckay has a pregnancy with the complications mentioned in the problem list. During today's visit we focused on the following concerns:   RE mental health conditions: The patient has a history of various mental health conditions including bipolar, depression, anxiety, PTSD and mood instability.  The patient reports that she no longer has any mental health problems except for PTSD and anxiety.  She follows with Dr. Jannifer Mckay and is currently on Abilify.  She believes this medication does help control her anxiety and PTSD but she does sometimes experience episodes of " talking to herself" where she hears voices and converses with them.  I encouraged her to contact her psychiatrist.  I also stated that Abilify is acceptable during pregnancy if the benefit outweighs the potential risk and she should consider this in her discussion with her psychiatrist.  In the past she has also been on Geodon and Zoloft per the EMR but cannot remember exactly which therapy was most helpful to her in the past.  RE fetal UTD: I discussed the  presence of mild urinary tract dilation.  The anterior to posterior diameter of the renal pelvis is measuring had a maximum diameter of 0.41 cm which is just above the abnormal value for this gestational age.  The fetus is a female.  I explained the various potential causes of fetal pyelectasis during pregnancy.  I encouraged the patient that this is likely a normal physiologic variant given how close it is to normal and that the fetus is female which more commonly shows urinary tract dilation.  If the finding persists or worsens later in pregnancy then she should be referred to pediatric urology after delivery.  RE EIF: This represents calcification of the papillary muscle within the left ventricle.  This is considered a normal variant in the setting of low risk aneuploidy screening.  The patient had a low risk panorama test.  There is no further workup required at this time.  RE asthma in pregnancy: Patient has a history of asthma that started as a child.  Her triggers include smoke exposure, cold, dust, and environmental allergies.  She rarely takes her albuterol inhaler.  She does not have a peak flow meter but I encouraged her to obtain 1.  We discussed the importance of monitoring her symptoms and to notify her OB provider if she ever uses her inhaler more than twice a week or ever at night.  I expressed that patients with asthma typically do well especially if its mild like hers appears to be.  At approximately one third do worsen during pregnancy especially around 26 to 32 weeks.  Patient denies  any history of hospitalization but has required breathing treatments in the emergency room in the past.  RE hx of genital HSV: known outbreak in the past but nothing current. Does not have frequent outbreaks but feels better taking valtrex.  I discussed the importance of notifying her OB provider if she has any outbreaks.  I discussed the potential transmission to the fetus is greatest during active outbreaks and  may change the route of delivery.  RE dermoid cyst: This is a known finding and the images do not suggest a significant change in size.  Today the mass measures about 3 x 4 cm.  If it remains less than 5 to 6 cm it is unlikely to cause a problem such as ovarian torsion.  As long as it does not grow or become torsed at any point in time it does not need to be removed and can be monitored long-term.  Long-term monitoring with repeat ultrasounds as recommended after pregnancy.  Sonographic findings Single intrauterine pregnancy at 18w 2d  Fetal cardiac activity:  Observed and appears normal. Presentation: Cephalic. The anatomic structures that were well seen appear normal except for mild UTD and a left sided EIF (normal variant). Due to poor acoustic windows some structures remain suboptimally visualized. Fetal biometry shows the estimated fetal weight at the 95 percentile.  Amniotic fluid: Within normal limits. Placenta: Anterior. Adnexa: Left ovarian dermoid measuring 4.6 x 4.2 x 3.7 cm Cervical length: 3.7 cm.  There are limitations of prenatal ultrasound such as the inability to detect certain abnormalities due to poor visualization. Various factors such as fetal position, gestational age and maternal body habitus may increase the difficulty in visualizing the fetal anatomy.    Recommendations -EDD is Estimated Date of Delivery: 05/17/24 based on her LMP which is known.  Her menstrual cycles are approximately 28 to 30 days which are close enough to use her LMP as her dating method. -Detailed ultrasound was done today without abnormalities but some of the fetal anatomy was not well-visualized today -Asthma: Continue as needed albuterol inhaler.  Peak flow meter encouraged.  Inhaled corticosteroid is recommended if she is ever uses her pro air albuterol inhaler more than twice a week or at night. -Mood: Close follow-up with psychiatry.  Patient to discuss Abilify given her current side  effects. -HSV: continue valtrex and monitor for symptoms/outbreaks with dose adjustment as needed.  -Dermoid: Stable in size, no further assessment needed unless symptoms arise. -Follow-up anatomy and fetal growth in 4 to 6 weeks -Serial growth ultrasounds starting around 28 weeks to monitor for fetal growth restriction -Continued assessment of the fetal kidneys.  Pediatric urology consult after delivery if the finding is persistent in the weeks prior to birth. -Delivery timing pending clinical course but likely around her due date or sooner if indicated. -Continue routine prenatal care with referring OB provider  Review of Systems: A review of systems was performed and was negative except per HPI   Vitals and Physical Exam    12/22/2023    8:04 AM 12/07/2023    2:27 PM 11/09/2023    8:49 AM  Vitals with BMI  Weight  192 lbs 2 oz 189 lbs  Systolic 123 119 478  Diastolic 77 79 83  Pulse 78 83 81    Sitting comfortably on the sonogram table Nonlabored breathing Normal rate and rhythm Abdomen is nontender  Past pregnancies OB History  Gravida Para Term Preterm AB Living  1 0 0 0 0 0  SAB  IAB Ectopic Multiple Live Births  0 0 0 0 0    # Outcome Date GA Lbr Len/2nd Weight Sex Type Anes PTL Lv  1 Current              I spent 60 minutes reviewing the patients chart, including labs and images as well as counseling the patient about her medical conditions. Greater than 50% of the time was spent in direct face-to-face patient counseling.  Braxton Feathers, DO Maternal fetal medicine, [facility]   12/22/2023  11:40 AM

## 2023-12-24 ENCOUNTER — Encounter: Payer: Self-pay | Admitting: Family Medicine

## 2023-12-24 DIAGNOSIS — O0992 Supervision of high risk pregnancy, unspecified, second trimester: Secondary | ICD-10-CM

## 2023-12-24 NOTE — Telephone Encounter (Signed)
 Spoke with patient via phone. She confirms she plans to breastfeed.  Discussed abilify can have impact on ability to produce breastmilk. This can occur not just if taken during postpartum period, but also if taken during pregnancy due to stunted alveolar tissue growth related to suppressed prolactin.  Recommend she speak with her psychiatrist Dr. Jannifer Franklin about the possibility of an alternative medication, since she desires to breastfeed. Encouraged her not to stop it abruptly without speaking to him first, as her mental health is the number one priority. Patient appreciative and will contact psychiatry.  She also asked if I will be baby's doctor - I would be honored. Note added to sticky note and OB overview box on problem list.  Patient very appreciative.  Latrelle Dodrill, MD, IBCLC  [facility]

## 2024-01-04 ENCOUNTER — Encounter: Payer: Self-pay | Admitting: Advanced Practice Midwife

## 2024-01-04 ENCOUNTER — Other Ambulatory Visit (HOSPITAL_COMMUNITY)
Admission: RE | Admit: 2024-01-04 | Discharge: 2024-01-04 | Disposition: A | Discharge status: Home / Self Care | Payer: MEDICAID | Source: Ambulatory Visit | Attending: Advanced Practice Midwife | Admitting: Advanced Practice Midwife

## 2024-01-04 ENCOUNTER — Ambulatory Visit (INDEPENDENT_AMBULATORY_CARE_PROVIDER_SITE_OTHER): Payer: MEDICAID | Admitting: Advanced Practice Midwife

## 2024-01-04 VITALS — BP 116/77 | HR 85 | Wt 199.2 lb

## 2024-01-04 DIAGNOSIS — N898 Other specified noninflammatory disorders of vagina: Secondary | ICD-10-CM

## 2024-01-04 DIAGNOSIS — D271 Benign neoplasm of left ovary: Secondary | ICD-10-CM | POA: Diagnosis not present

## 2024-01-04 DIAGNOSIS — Z3480 Encounter for supervision of other normal pregnancy, unspecified trimester: Secondary | ICD-10-CM

## 2024-01-04 DIAGNOSIS — B3731 Acute candidiasis of vulva and vagina: Secondary | ICD-10-CM

## 2024-01-04 MED ORDER — TERCONAZOLE 0.4 % VA CREA: 1.0000 | TOPICAL_CREAM | Freq: Every day | VAGINAL | 0 refills | Status: DC

## 2024-01-04 NOTE — Progress Notes (Signed)
   PRENATAL VISIT NOTE  Subjective:  Deanna Mckay is a 23 y.o. G1P0000 at [redacted]w[redacted]d being seen today for ongoing prenatal care.  She is currently monitored for the following issues for this low-risk pregnancy and has Obesity; Asthma; Generalized anxiety disorder; Major depression, recurrent (HCC); GERD (gastroesophageal reflux disease); Genital herpes; Dermoid cyst of left ovary; Supervision of high risk pregnancy, antepartum, second trimester; Rubella non-immune status, antepartum; Obesity affecting pregnancy, antepartum; Echogenic focus of heart of fetus affecting antepartum care of mother, single gestation; and Fetal renal anomaly, single gestation (mild UTD) on their problem list.  Patient reports  thick white vaginal discharge, sometimes green tinged, similar to previous yeast infections .  Contractions: Not present. Vag. Bleeding: None.  Movement: Increased. Denies leaking of fluid.   The following portions of the patient's history were reviewed and updated as appropriate: allergies, current medications, past family history, past medical history, past social history, past surgical history and problem list.   Objective:   Vitals:   01/04/24 1611  BP: 116/77  Pulse: 85  Weight: 199 lb 3.2 oz (90.4 kg)    Fetal Status: Fetal Heart Rate (bpm): 149   Movement: Increased     General:  Alert, oriented and cooperative. Patient is in no acute distress.  Skin: Skin is warm and dry. No rash noted.   Cardiovascular: Normal heart rate noted  Respiratory: Normal respiratory effort, no problems with respiration noted  Abdomen: Soft, gravid, appropriate for gestational age.  Pain/Pressure: Absent     Pelvic: Cervical exam deferred        Extremities: Normal range of motion.  Edema: Trace  Mental Status: Normal mood and affect. Normal behavior. Normal judgment and thought content.   Assessment and Plan:  Pregnancy: G1P0000 at [redacted]w[redacted]d 1. Supervision of other normal pregnancy,  antepartum --Anticipatory guidance about next visits/weeks of pregnancy given.  --Reviewed recent anatomy US  with UTD and EICF, follow up US  scheduled  2. Vaginal discharge (Primary) --Treat for yeast with Terazol 7  --Discussed prevention, including probiotics, pt washing with Hibiclens, encouraged to wash less and use less harsh soap.  - Cervicovaginal ancillary only( [place])  3. Dermoid cyst of left ovary --Stable 3x4cm, follow up imaging postpartum per MFM  4. Vaginal candidiasis  - terconazole  (TERAZOL 7 ) 0.4 % vaginal cream; Place 1 applicator vaginally at bedtime.  Dispense: 45 g; Refill: 0    Preterm labor symptoms and general obstetric precautions including but not limited to vaginal bleeding, contractions, leaking of fluid and fetal movement were reviewed in detail with the patient. Please refer to After Visit Summary for other counseling recommendations.   Return in about 4 weeks (around 02/01/2024) for LOB, Midwife preferred.  Future Appointments  Date Time Provider Department Center  01/26/2024  3:00 PM [facility] PROVIDER 1 [facility]  01/26/2024  3:30 PM [facility] US4 [facility]  02/01/2024  4:10 PM Leftwich-Kirby, Darren Em, CNM [facility] None    Arlester Bence, CNM

## 2024-01-04 NOTE — Progress Notes (Signed)
 Pt states constant, plentiful white thick discharge, sometimes presents with light green color as well. No pain. Vaginal burning with urination. Itching on inside and outside of vagina.   Pt asking about refill of valtrex .

## 2024-01-06 LAB — CERVICOVAGINAL ANCILLARY ONLY
Bacterial Vaginitis (gardnerella): NEGATIVE
Candida Glabrata: NEGATIVE
Candida Vaginitis: NEGATIVE
Chlamydia: NEGATIVE
Comment: NEGATIVE
Comment: NEGATIVE
Comment: NEGATIVE
Comment: NEGATIVE
Comment: NEGATIVE
Comment: NORMAL
Neisseria Gonorrhea: NEGATIVE
Trichomonas: NEGATIVE

## 2024-01-26 ENCOUNTER — Ambulatory Visit: Payer: MEDICAID | Attending: Obstetrics and Gynecology | Admitting: Obstetrics and Gynecology

## 2024-01-26 ENCOUNTER — Ambulatory Visit: Payer: MEDICAID

## 2024-01-26 DIAGNOSIS — Z3A23 23 weeks gestation of pregnancy: Secondary | ICD-10-CM | POA: Diagnosis not present

## 2024-01-26 DIAGNOSIS — E669 Obesity, unspecified: Secondary | ICD-10-CM | POA: Diagnosis not present

## 2024-01-26 DIAGNOSIS — O99343 Other mental disorders complicating pregnancy, third trimester: Secondary | ICD-10-CM

## 2024-01-26 DIAGNOSIS — O4692 Antepartum hemorrhage, unspecified, second trimester: Secondary | ICD-10-CM | POA: Insufficient documentation

## 2024-01-26 DIAGNOSIS — O99212 Obesity complicating pregnancy, second trimester: Secondary | ICD-10-CM | POA: Insufficient documentation

## 2024-01-26 DIAGNOSIS — O98512 Other viral diseases complicating pregnancy, second trimester: Secondary | ICD-10-CM | POA: Insufficient documentation

## 2024-01-26 DIAGNOSIS — O99342 Other mental disorders complicating pregnancy, second trimester: Secondary | ICD-10-CM | POA: Diagnosis not present

## 2024-01-26 DIAGNOSIS — F319 Bipolar disorder, unspecified: Secondary | ICD-10-CM | POA: Diagnosis not present

## 2024-01-26 DIAGNOSIS — O35BXX0 Maternal care for other (suspected) fetal abnormality and damage, fetal cardiac anomalies, not applicable or unspecified: Secondary | ICD-10-CM | POA: Diagnosis not present

## 2024-01-26 DIAGNOSIS — Z3689 Encounter for other specified antenatal screening: Secondary | ICD-10-CM | POA: Insufficient documentation

## 2024-01-26 DIAGNOSIS — B009 Herpesviral infection, unspecified: Secondary | ICD-10-CM | POA: Insufficient documentation

## 2024-01-26 DIAGNOSIS — Z3A24 24 weeks gestation of pregnancy: Secondary | ICD-10-CM | POA: Diagnosis not present

## 2024-01-26 DIAGNOSIS — O3482 Maternal care for other abnormalities of pelvic organs, second trimester: Secondary | ICD-10-CM | POA: Insufficient documentation

## 2024-01-26 DIAGNOSIS — O99213 Obesity complicating pregnancy, third trimester: Secondary | ICD-10-CM | POA: Diagnosis not present

## 2024-01-26 DIAGNOSIS — D279 Benign neoplasm of unspecified ovary: Secondary | ICD-10-CM

## 2024-01-26 DIAGNOSIS — O99210 Obesity complicating pregnancy, unspecified trimester: Secondary | ICD-10-CM

## 2024-01-26 DIAGNOSIS — O35EXX0 Maternal care for other (suspected) fetal abnormality and damage, fetal genitourinary anomalies, not applicable or unspecified: Secondary | ICD-10-CM

## 2024-01-26 NOTE — Progress Notes (Signed)
 Maternal-Fetal Medicine Consultation Name: Deanna Mckay MRN: [id number]  G1 P0 at 24-weeks' gestation.  Patient returned for completion of fetal anatomy. She had vaginal bleeding last week that stopped.  No history of sexual intercourse before the onset of bleeding.  Ultrasound Fetal growth is appropriate for gestational age.  Amniotic fluid is normal good fetal activity seen.  Bilateral urinary tract dilation's each measuring 6 mm are seen.  Both kidneys, otherwise, appear normal.  Placenta appears normal with no evidence of placental abruption. Left ovarian dermoid is seen again (measurements and ultrasound report). Fetal anatomical survey was completed and appears normal.  I explained the finding of bilateral UTD and reassured the patient that in most cases, UTD is resolved with advancing gestation and are only rarely associated with obstructive uropathy.  Patient does not have symptoms pertaining to the dermoid. I reassured the patient of normal placental findings.  Ultrasound has limitations in diagnosing placental abruption. If patient has recurrent vaginal bleeding, the risk of preterm delivery is increased.  Recommendations - An appointment was made for her to return in 9 weeks for fetal growth and renal assessments.  Consultation including face-to-face (more than 50%) counseling 20 minutes.

## 2024-01-27 ENCOUNTER — Other Ambulatory Visit: Payer: Self-pay | Admitting: *Deleted

## 2024-01-27 DIAGNOSIS — O35EXX0 Maternal care for other (suspected) fetal abnormality and damage, fetal genitourinary anomalies, not applicable or unspecified: Secondary | ICD-10-CM

## 2024-02-01 ENCOUNTER — Ambulatory Visit (INDEPENDENT_AMBULATORY_CARE_PROVIDER_SITE_OTHER): Payer: MEDICAID | Admitting: Advanced Practice Midwife

## 2024-02-01 ENCOUNTER — Encounter: Payer: Self-pay | Admitting: Advanced Practice Midwife

## 2024-02-01 ENCOUNTER — Other Ambulatory Visit (HOSPITAL_COMMUNITY)
Admission: RE | Admit: 2024-02-01 | Discharge: 2024-02-01 | Disposition: A | Discharge status: Home / Self Care | Payer: MEDICAID | Source: Ambulatory Visit | Attending: Advanced Practice Midwife | Admitting: Advanced Practice Midwife

## 2024-02-01 VITALS — BP 111/74 | HR 80 | Wt 202.4 lb

## 2024-02-01 DIAGNOSIS — O4692 Antepartum hemorrhage, unspecified, second trimester: Secondary | ICD-10-CM | POA: Diagnosis present

## 2024-02-01 DIAGNOSIS — Z3A24 24 weeks gestation of pregnancy: Secondary | ICD-10-CM

## 2024-02-01 DIAGNOSIS — Z3480 Encounter for supervision of other normal pregnancy, unspecified trimester: Secondary | ICD-10-CM

## 2024-02-01 DIAGNOSIS — B3731 Acute candidiasis of vulva and vagina: Secondary | ICD-10-CM | POA: Diagnosis not present

## 2024-02-01 NOTE — Progress Notes (Signed)
 Pt presents for rob. Pt has no questions or concerns at this time.

## 2024-02-01 NOTE — Progress Notes (Signed)
   PRENATAL VISIT NOTE  Subjective:  EULALA NEWCOMBE is a 23 y.o. G1P0000 at [redacted]w[redacted]d being seen today for ongoing prenatal care.  She is currently monitored for the following issues for this low-risk pregnancy and has Obesity; Asthma; Generalized anxiety disorder; Major depression, recurrent (HCC); GERD (gastroesophageal reflux disease); Genital herpes; Dermoid cyst of left ovary; Supervision of high risk pregnancy, antepartum, second trimester; Rubella non-immune status, antepartum; Obesity affecting pregnancy, antepartum; Echogenic focus of heart of fetus affecting antepartum care of mother, single gestation; and Fetal renal anomaly, single gestation (mild UTD) on their problem list.  Patient reports vaginal spotting, brown, no intercourse before bleeding.  Yeast infection symptoms have cleared up since last visit.  Contractions: Not present. Vag. Bleeding: Scant (very small).  Movement: Present. Denies leaking of fluid.   The following portions of the patient's history were reviewed and updated as appropriate: allergies, current medications, past family history, past medical history, past social history, past surgical history and problem list.   Objective:    Vitals:   02/01/24 1612  BP: 111/74  Pulse: 80  Weight: 202 lb 6.4 oz (91.8 kg)    Fetal Status:  Fetal Heart Rate (bpm): 140   Movement: Present    General: Alert, oriented and cooperative. Patient is in no acute distress.  Skin: Skin is warm and dry. No rash noted.   Cardiovascular: Normal heart rate noted  Respiratory: Normal respiratory effort, no problems with respiration noted  Abdomen: Soft, gravid, appropriate for gestational age.  Pain/Pressure: Absent     Pelvic: SSE with no bleeding in vaginal vault, cervix beefy red, closed and long in appearance, not friable to cotton swab.  Moderate amount milky white d/c.        Extremities: Normal range of motion.  Edema: Trace  Mental Status: Normal mood and affect. Normal  behavior. Normal judgment and thought content.   Assessment and Plan:  Pregnancy: G1P0000 at [redacted]w[redacted]d 1. Supervision of other normal pregnancy, antepartum (Primary) --Anticipatory guidance about next visits/weeks of pregnancy given.   2. Vaginal candidiasis --Symptoms have improved, given spotting today, will repeat swab  3. Vaginal bleeding in pregnancy, second trimester -SSE without evidence of bleeding, vaginal swab collected, warning signs/where to go to [facility] reviewed.  4. [redacted] weeks gestation of pregnancy   Preterm labor symptoms and general obstetric precautions including but not limited to vaginal bleeding, contractions, leaking of fluid and fetal movement were reviewed in detail with the patient. Please refer to After Visit Summary for other counseling recommendations.   Return for Schedule lab only for GTT 5/28 ,5/29, or 5/30, then prenatal visit in 2 weeks with midwife.  Future Appointments  Date Time Provider Department Center  03/28/2024  3:15 PM [facility] PROVIDER 1 [facility]  03/28/2024  3:45 PM [facility] US4 [facility]    Arlester Bence, CNM

## 2024-02-03 LAB — CERVICOVAGINAL ANCILLARY ONLY
Bacterial Vaginitis (gardnerella): NEGATIVE
Candida Glabrata: NEGATIVE
Candida Vaginitis: NEGATIVE
Chlamydia: NEGATIVE
Comment: NEGATIVE
Comment: NEGATIVE
Comment: NEGATIVE
Comment: NEGATIVE
Comment: NEGATIVE
Comment: NORMAL
Neisseria Gonorrhea: NEGATIVE
Trichomonas: NEGATIVE

## 2024-02-11 ENCOUNTER — Other Ambulatory Visit: Payer: MEDICAID

## 2024-02-11 DIAGNOSIS — Z3A26 26 weeks gestation of pregnancy: Secondary | ICD-10-CM

## 2024-02-11 DIAGNOSIS — J301 Allergic rhinitis due to pollen: Secondary | ICD-10-CM

## 2024-02-11 MED ORDER — VALACYCLOVIR HCL 500 MG PO TABS: 500.0000 mg | ORAL_TABLET | Freq: Every day | ORAL | 1 refills | Status: DC

## 2024-02-11 MED ORDER — LORATADINE 10 MG PO TABS: 10.0000 mg | ORAL_TABLET | Freq: Every day | ORAL | 11 refills | Status: AC

## 2024-02-12 LAB — RPR: RPR Ser Ql: NONREACTIVE

## 2024-02-12 LAB — CBC
Hematocrit: 31.3 % — ABNORMAL LOW (ref 34.0–46.6)
Hemoglobin: 9.9 g/dL — ABNORMAL LOW (ref 11.1–15.9)
MCH: 31.2 pg (ref 26.6–33.0)
MCHC: 31.6 g/dL (ref 31.5–35.7)
MCV: 99 fL — ABNORMAL HIGH (ref 79–97)
Platelets: 304 10*3/uL (ref 150–450)
RBC: 3.17 x10E6/uL — ABNORMAL LOW (ref 3.77–5.28)
RDW: 13.5 % (ref 11.7–15.4)
WBC: 9.5 10*3/uL (ref 3.4–10.8)

## 2024-02-12 LAB — GLUCOSE TOLERANCE, 2 HOURS W/ 1HR
Glucose, 1 hour: 93 mg/dL (ref 70–179)
Glucose, 2 hour: 88 mg/dL (ref 70–152)
Glucose, Fasting: 77 mg/dL (ref 70–91)

## 2024-02-12 LAB — HIV ANTIBODY (ROUTINE TESTING W REFLEX): HIV Screen 4th Generation wRfx: NONREACTIVE

## 2024-02-14 ENCOUNTER — Ambulatory Visit: Payer: Self-pay | Admitting: Obstetrics and Gynecology

## 2024-02-15 ENCOUNTER — Ambulatory Visit (INDEPENDENT_AMBULATORY_CARE_PROVIDER_SITE_OTHER): Payer: MEDICAID | Admitting: Advanced Practice Midwife

## 2024-02-15 ENCOUNTER — Encounter: Payer: Self-pay | Admitting: Advanced Practice Midwife

## 2024-02-15 VITALS — BP 115/76 | HR 87 | Wt 204.0 lb

## 2024-02-15 DIAGNOSIS — O99013 Anemia complicating pregnancy, third trimester: Secondary | ICD-10-CM | POA: Diagnosis not present

## 2024-02-15 DIAGNOSIS — Z3A26 26 weeks gestation of pregnancy: Secondary | ICD-10-CM | POA: Diagnosis not present

## 2024-02-15 DIAGNOSIS — Z3480 Encounter for supervision of other normal pregnancy, unspecified trimester: Secondary | ICD-10-CM

## 2024-02-15 DIAGNOSIS — J4520 Mild intermittent asthma, uncomplicated: Secondary | ICD-10-CM

## 2024-02-15 MED ORDER — ACCRUFER 30 MG PO CAPS
1.0000 | ORAL_CAPSULE | Freq: Every day | ORAL | 3 refills | Status: DC
Start: 2024-02-15 — End: 2024-05-04

## 2024-02-15 MED ORDER — ALBUTEROL SULFATE HFA 108 (90 BASE) MCG/ACT IN AERS: 2.0000 | INHALATION_SPRAY | Freq: Four times a day (QID) | RESPIRATORY_TRACT | 2 refills | Status: AC | PRN

## 2024-02-15 NOTE — Progress Notes (Signed)
 Pt presents for ROB visit. No concerns

## 2024-02-15 NOTE — Progress Notes (Signed)
   PRENATAL VISIT NOTE  Subjective:  Deanna Mckay is a 23 y.o. G1P0000 at [redacted]w[redacted]d being seen today for ongoing prenatal care.  She is currently monitored for the following issues for this low-risk pregnancy and has Obesity; Asthma; Generalized anxiety disorder; Major depression, recurrent (HCC); GERD (gastroesophageal reflux disease); Genital herpes; Dermoid cyst of left ovary; Supervision of high risk pregnancy, antepartum, second trimester; Rubella non-immune status, antepartum; Obesity affecting pregnancy, antepartum; Echogenic focus of heart of fetus affecting antepartum care of mother, single gestation; and Fetal renal anomaly, single gestation (mild UTD) on their problem list.  Patient reports no complaints.  Contractions: Not present. Vag. Bleeding: None.  Movement: Present. Denies leaking of fluid.   The following portions of the patient's history were reviewed and updated as appropriate: allergies, current medications, past family history, past medical history, past social history, past surgical history and problem list.   Objective:    Vitals:   02/15/24 1621  BP: 115/76  Pulse: 87  Weight: 204 lb (92.5 kg)    Fetal Status:  Fetal Heart Rate (bpm): 147 Fundal Height: 27 cm Movement: Present    General: Alert, oriented and cooperative. Patient is in no acute distress.  Skin: Skin is warm and dry. No rash noted.   Cardiovascular: Normal heart rate noted  Respiratory: Normal respiratory effort, no problems with respiration noted  Abdomen: Soft, gravid, appropriate for gestational age.  Pain/Pressure: Absent     Pelvic: Cervical exam deferred        Extremities: Normal range of motion.  Edema: Trace  Mental Status: Normal mood and affect. Normal behavior. Normal judgment and thought content.   Assessment and Plan:  Pregnancy: G1P0000 at [redacted]w[redacted]d 1. Supervision of other normal pregnancy, antepartum (Primary) --Anticipatory guidance about next visits/weeks of pregnancy given.    2. [redacted] weeks gestation of pregnancy   3. Anemia affecting pregnancy in third trimester --Hbb 9.9 on 28 week labs - Ferric Maltol (ACCRUFER) 30 MG CAPS; Take 1 capsule (30 mg total) by mouth daily.  Dispense: 30 capsule; Refill: 3  4. Mild intermittent asthma without complication --Pt had inhaler but wasn't using it prior to pregnancy. PCP discontinued it because pt didn't need it  In pregnancy, SOB with exertion, feels like inhaler wold help.   - albuterol  (VENTOLIN  HFA) 108 (90 Base) MCG/ACT inhaler; Inhale 2 puffs into the lungs every 6 (six) hours as needed for wheezing or shortness of breath.  Dispense: 8 g; Refill: 2   Preterm labor symptoms and general obstetric precautions including but not limited to vaginal bleeding, contractions, leaking of fluid and fetal movement were reviewed in detail with the patient. Please refer to After Visit Summary for other counseling recommendations.   Return in about 2 weeks (around 02/29/2024) for Midwife preferred, LOB.  Future Appointments  Date Time Provider Department Center  02/29/2024  4:10 PM Tresia Fruit, MD [facility] None  03/28/2024  3:15 PM [facility] PROVIDER 1 [facility]  03/28/2024  3:45 PM [facility]4 [facility]    Arlester Bence, CNM

## 2024-02-29 ENCOUNTER — Ambulatory Visit (INDEPENDENT_AMBULATORY_CARE_PROVIDER_SITE_OTHER): Payer: MEDICAID | Admitting: Obstetrics & Gynecology

## 2024-02-29 VITALS — BP 104/71 | HR 96 | Wt 208.0 lb

## 2024-02-29 DIAGNOSIS — O0992 Supervision of high risk pregnancy, unspecified, second trimester: Secondary | ICD-10-CM | POA: Diagnosis not present

## 2024-02-29 DIAGNOSIS — Z6831 Body mass index (BMI) 31.0-31.9, adult: Secondary | ICD-10-CM | POA: Diagnosis not present

## 2024-02-29 DIAGNOSIS — E66811 Obesity, class 1: Secondary | ICD-10-CM

## 2024-02-29 NOTE — Progress Notes (Signed)
   PRENATAL VISIT NOTE  Subjective:  Deanna Mckay is a 23 y.o. G1P0000 at [redacted]w[redacted]d being seen today for ongoing prenatal care.  She is currently monitored for the following issues for this low-risk pregnancy and has Obesity; Asthma; Generalized anxiety disorder; Major depression, recurrent (HCC); GERD (gastroesophageal reflux disease); Genital herpes; Dermoid cyst of left ovary; Supervision of high risk pregnancy, antepartum, second trimester; Rubella non-immune status, antepartum; Obesity affecting pregnancy, antepartum; Echogenic focus of heart of fetus affecting antepartum care of mother, single gestation; and Fetal renal anomaly, single gestation (mild UTD) on their problem list.  Patient reports no complaints.  Contractions: Not present. Vag. Bleeding: None.  Movement: Present. Denies leaking of fluid.   The following portions of the patient's history were reviewed and updated as appropriate: allergies, current medications, past family history, past medical history, past social history, past surgical history and problem list.   Objective:    Vitals:   02/29/24 1610  BP: 104/71  Pulse: 96  Weight: 208 lb (94.3 kg)    Fetal Status:  Fetal Heart Rate (bpm): 140   Movement: Present    General: Alert, oriented and cooperative. Patient is in no acute distress.  Skin: Skin is warm and dry. No rash noted.   Cardiovascular: Normal heart rate noted  Respiratory: Normal respiratory effort, no problems with respiration noted  Abdomen: Soft, gravid, appropriate for gestational age.  Pain/Pressure: Absent     Pelvic: Cervical exam deferred        Extremities: Normal range of motion.     Mental Status: Normal mood and affect. Normal behavior. Normal judgment and thought content.   Assessment and Plan:  Pregnancy: G1P0000 at [redacted]w[redacted]d 1. Supervision of high risk pregnancy, antepartum, second trimester (Primary)   2. Class 1 obesity with body mass index (BMI) of 31.0 to 31.9 in adult,  unspecified obesity type, unspecified whether serious comorbidity present   Preterm labor symptoms and general obstetric precautions including but not limited to vaginal bleeding, contractions, leaking of fluid and fetal movement were reviewed in detail with the patient. Please refer to After Visit Summary for other counseling recommendations.   Return in about 3 weeks (around 03/21/2024).  Future Appointments  Date Time Provider Department Center  03/21/2024  4:10 PM Tresia Fruit, MD [facility] None  03/28/2024  3:15 PM [facility] PROVIDER 1 [facility]  03/28/2024  3:45 PM [facility] US4 [facility]    Onnie Bilis, MD

## 2024-03-21 ENCOUNTER — Ambulatory Visit (INDEPENDENT_AMBULATORY_CARE_PROVIDER_SITE_OTHER): Payer: MEDICAID | Admitting: Obstetrics & Gynecology

## 2024-03-21 VITALS — BP 113/77 | HR 82 | Wt 213.0 lb

## 2024-03-21 DIAGNOSIS — O09899 Supervision of other high risk pregnancies, unspecified trimester: Secondary | ICD-10-CM | POA: Diagnosis not present

## 2024-03-21 DIAGNOSIS — Z2839 Other underimmunization status: Secondary | ICD-10-CM | POA: Diagnosis not present

## 2024-03-21 DIAGNOSIS — O0992 Supervision of high risk pregnancy, unspecified, second trimester: Secondary | ICD-10-CM | POA: Diagnosis not present

## 2024-03-21 DIAGNOSIS — O99210 Obesity complicating pregnancy, unspecified trimester: Secondary | ICD-10-CM | POA: Diagnosis not present

## 2024-03-21 NOTE — Progress Notes (Signed)
 ROB, c/o swelling in her feet, pain in ankles.

## 2024-03-21 NOTE — Progress Notes (Signed)
   PRENATAL VISIT NOTE  Subjective:  Deanna Mckay is a 23 y.o. G1P0000 at [redacted]w[redacted]d being seen today for ongoing prenatal care.  She is currently monitored for the following issues for this low-risk pregnancy and has Obesity; Asthma; Generalized anxiety disorder; Major depression, recurrent (HCC); GERD (gastroesophageal reflux disease); Genital herpes; Dermoid cyst of left ovary; Supervision of high risk pregnancy, antepartum, second trimester; Rubella non-immune status, antepartum; Obesity affecting pregnancy, antepartum; Echogenic focus of heart of fetus affecting antepartum care of mother, single gestation; and Fetal renal anomaly, single gestation (mild UTD) on their problem list.  Patient reports pedal edema develops soon after standing in the morning.  Contractions: Irritability. Vag. Bleeding: None.  Movement: Present. Denies leaking of fluid.   The following portions of the patient's history were reviewed and updated as appropriate: allergies, current medications, past family history, past medical history, past social history, past surgical history and problem list.   Objective:    Vitals:   03/21/24 1629  BP: 113/77  Pulse: 82  Weight: 213 lb (96.6 kg)    Fetal Status:  Fetal Heart Rate (bpm): 135 Fundal Height: 31 cm Movement: Present    General: Alert, oriented and cooperative. Patient is in no acute distress.  Skin: Skin is warm and dry. No rash noted.   Cardiovascular: Normal heart rate noted  Respiratory: Normal respiratory effort, no problems with respiration noted  Abdomen: Soft, gravid, appropriate for gestational age.  Pain/Pressure: Present     Pelvic: Cervical exam deferred        Extremities: Normal range of motion.  Edema: Mild pitting, slight indentation  Mental Status: Normal mood and affect. Normal behavior. Normal judgment and thought content.   Assessment and Plan:  Pregnancy: G1P0000 at [redacted]w[redacted]d 1. Supervision of high risk pregnancy, antepartum, second  trimester (Primary) F/u US  scheduled  2. Rubella non-immune status, antepartum   3. Obesity affecting pregnancy, antepartum, unspecified obesity type Mild edema no other concern for preeclampsia  Preterm labor symptoms and general obstetric precautions including but not limited to vaginal bleeding, contractions, leaking of fluid and fetal movement were reviewed in detail with the patient. Please refer to After Visit Summary for other counseling recommendations.   Return in about 2 weeks (around 04/04/2024).  Future Appointments  Date Time Provider Department Center  03/28/2024  3:15 PM [facility] PROVIDER 1 [facility]  03/28/2024  3:45 PM [facility] US4 [facility]  04/05/2024  4:10 PM Davis, Devon E, PA-C [facility] None    Lynwood Solomons, MD

## 2024-03-28 ENCOUNTER — Ambulatory Visit (HOSPITAL_BASED_OUTPATIENT_CLINIC_OR_DEPARTMENT_OTHER): Payer: MEDICAID | Admitting: Obstetrics and Gynecology

## 2024-03-28 ENCOUNTER — Ambulatory Visit: Payer: MEDICAID | Attending: Obstetrics and Gynecology

## 2024-03-28 VITALS — BP 129/68 | HR 72

## 2024-03-28 DIAGNOSIS — O3483 Maternal care for other abnormalities of pelvic organs, third trimester: Secondary | ICD-10-CM | POA: Diagnosis not present

## 2024-03-28 DIAGNOSIS — O99213 Obesity complicating pregnancy, third trimester: Secondary | ICD-10-CM

## 2024-03-28 DIAGNOSIS — Z3A00 Weeks of gestation of pregnancy not specified: Secondary | ICD-10-CM

## 2024-03-28 DIAGNOSIS — O35EXX0 Maternal care for other (suspected) fetal abnormality and damage, fetal genitourinary anomalies, not applicable or unspecified: Secondary | ICD-10-CM | POA: Diagnosis present

## 2024-03-28 DIAGNOSIS — Z3A32 32 weeks gestation of pregnancy: Secondary | ICD-10-CM

## 2024-03-28 DIAGNOSIS — E669 Obesity, unspecified: Secondary | ICD-10-CM

## 2024-03-28 DIAGNOSIS — O0992 Supervision of high risk pregnancy, unspecified, second trimester: Secondary | ICD-10-CM

## 2024-03-28 DIAGNOSIS — D279 Benign neoplasm of unspecified ovary: Secondary | ICD-10-CM

## 2024-03-28 NOTE — Progress Notes (Signed)
 After review, MFM consult with provider is not indicated for today  Arna Ranks, MD 03/28/2024 5:14 PM  [facility]

## 2024-03-30 ENCOUNTER — Encounter (HOSPITAL_BASED_OUTPATIENT_CLINIC_OR_DEPARTMENT_OTHER): Payer: Self-pay

## 2024-03-30 ENCOUNTER — Emergency Department (HOSPITAL_BASED_OUTPATIENT_CLINIC_OR_DEPARTMENT_OTHER)
Admission: EM | Admit: 2024-03-30 | Discharge: 2024-03-30 | Disposition: A | Discharge status: Home / Self Care | Payer: MEDICAID | Attending: Emergency Medicine | Admitting: Emergency Medicine

## 2024-03-30 ENCOUNTER — Other Ambulatory Visit: Payer: Self-pay

## 2024-03-30 DIAGNOSIS — O26891 Other specified pregnancy related conditions, first trimester: Secondary | ICD-10-CM | POA: Diagnosis present

## 2024-03-30 DIAGNOSIS — Z3A01 Less than 8 weeks gestation of pregnancy: Secondary | ICD-10-CM | POA: Insufficient documentation

## 2024-03-30 DIAGNOSIS — J45909 Unspecified asthma, uncomplicated: Secondary | ICD-10-CM | POA: Insufficient documentation

## 2024-03-30 DIAGNOSIS — R519 Headache, unspecified: Secondary | ICD-10-CM | POA: Insufficient documentation

## 2024-03-30 DIAGNOSIS — M542 Cervicalgia: Secondary | ICD-10-CM | POA: Insufficient documentation

## 2024-03-30 MED ORDER — PREDNISONE 20 MG PO TABS: ORAL_TABLET | ORAL | 0 refills | Status: DC

## 2024-03-30 NOTE — Discharge Instructions (Signed)
 Have close follow-up with your OB/GYN.  Return to the emergency room if you have any worsening symptoms.

## 2024-03-30 NOTE — ED Provider Notes (Signed)
  EMERGENCY DEPARTMENT AT [facility] Provider Note   CSN: [id number] Arrival date & time: 03/30/24  8367     Patient presents with: Facial Pain and Headache   Deanna Mckay is a 23 y.o. female.   Patient is a 23 year old female who presents with some pain in her left face.  She says it has been going on about 2 weeks.  Its intermittent pain that usually comes about once a day.  It lasts a few minutes.  Sometimes she will take Tylenol  to make it go away but sometimes it will go away on its own.  Seems to originate around her left ear.  It radiates up to her head and down to her jaw.  She has a little bit of pain in her neck behind her ear.  She currently does not have any pain.  No associated nausea or vomiting.  No vision changes or speech deficits.  No numbness to the face.  No numbness or weakness in her extremities.  She is currently [redacted] weeks pregnant.  She denies any complications with the pregnancy.  She says she has been feeling the baby move around.  No bleeding or leakage of fluid.       Prior to Admission medications   Medication Sig Start Date End Date Taking? Authorizing Provider  predniSONE  (DELTASONE ) 20 MG tablet 3 tabs po day one, then 2 tabs daily x 4 days 03/30/24  Yes Lenor Hollering, MD  albuterol  (VENTOLIN  HFA) 108 (90 Base) MCG/ACT inhaler Inhale 2 puffs into the lungs every 6 (six) hours as needed for wheezing or shortness of breath. 02/15/24   Leftwich-Kirby, Olam LABOR, CNM  Blood Pressure Monitoring (BLOOD PRESSURE KIT) DEVI 1 Device by Does not apply route once a week. 10/13/23   Eveline Lynwood MATSU, MD  Cholecalciferol (VITAMIN D3) 50 MCG (2000 UT) capsule Take 1 capsule (2,000 Units total) by mouth daily. 02/24/19   Lenon Marien CROME, MD  EPINEPHrine  (EPIPEN  2-PAK) 0.3 mg/0.3 mL IJ SOAJ injection Inject 0.3 mLs (0.3 mg total) into the muscle as needed for anaphylaxis (if you use this, you must call 911 immediately). 03/27/19   Haskins, Erma SAUNDERS, NP   Ferric Maltol  (ACCRUFER ) 30 MG CAPS Take 1 capsule (30 mg total) by mouth daily. 02/15/24   Leftwich-Kirby, Olam LABOR, CNM  hydrocortisone  cream 1 % Apply 1 application topically 2 (two) times daily as needed (eczema). 07/18/19   Delane Lye, MD  loratadine  (CLARITIN ) 10 MG tablet Take 1 tablet (10 mg total) by mouth daily. 02/11/24   Rudy Carlin LABOR, MD  omeprazole  (PRILOSEC) 20 MG capsule TAKE 1 CAPSULE(20 MG) BY MOUTH DAILY 08/18/23   McIntyre, Brittany J, MD  polyethylene glycol powder (GLYCOLAX /MIRALAX ) 17 GM/SCOOP powder Take 17 g by mouth as needed for moderate constipation, mild constipation or severe constipation. 10/25/17   [provider]  Prenat-Fe Poly-Methfol-FA-DHA (VITAFOL  ULTRA) 29-0.6-0.4-200 MG CAPS Take 1 capsule by mouth daily before breakfast. 11/09/23   Rudy Carlin LABOR, MD  Prenatal Vit-Fe Fumarate-FA (PRENATAL VITAMIN PO) Take 1 tablet by mouth daily. Patient not taking: Reported on 12/07/2023    [provider]  sertraline (ZOLOFT) 25 MG tablet Take 25 mg by mouth daily.    [provider]  terconazole  (TERAZOL 7 ) 0.4 % vaginal cream Place 1 applicator vaginally at bedtime. Patient not taking: Reported on 02/15/2024 01/04/24   Milly Olam A, CNM  valACYclovir  (VALTREX ) 500 MG tablet Take 1 tablet (500 mg total) by mouth daily.  02/11/24   Rudy Carlin LABOR, MD  metFORMIN  (GLUCOPHAGE ) 500 MG tablet Take 1 tablet (500 mg total) by mouth 2 (two) times daily with a meal. 05/07/11 12/04/11  Hershal Ozell PARAS, MD    Allergies: Other, Trichophyton, Midazolam  hcl, Penicillins, Soy allergy (obsolete), Versed  [midazolam  hcl], Ranitidine  hcl, and Zantac  [ranitidine  hcl]    Review of Systems  Constitutional:  Negative for fever.  HENT:  Negative for congestion, ear discharge, ear pain, facial swelling, rhinorrhea and sore throat.   Respiratory:  Negative for cough.   Cardiovascular:  Negative for chest pain.  Gastrointestinal:  Negative for abdominal  pain, nausea and vomiting.  Genitourinary:  Negative for vaginal bleeding and vaginal discharge.  Musculoskeletal:  Negative for arthralgias.  Skin:  Negative for rash.    Updated Vital Signs BP 124/76   Pulse 85   Temp 98.4 F (36.9 C)   Resp 16   Ht 5' 4 (1.626 m)   Wt 98.4 kg   LMP 08/11/2023 (Exact Date)   SpO2 99%   BMI 37.25 kg/m   Physical Exam HENT:     Head: Normocephalic and atraumatic.     Ears:     Comments: Left TM is normal-appearing.  Canal appears normal.  No pain over the mastoid, no pain over the tragus    Mouth/Throat:     Mouth: Mucous membranes are moist.     Pharynx: Oropharynx is clear.  Eyes:     Extraocular Movements: Extraocular movements intact.     Pupils: Pupils are equal, round, and reactive to light.     Comments: Noticed Agnus, visual fields full to confrontation  Neck:     Comments: Mild tenderness to the left upper paracervical area Musculoskeletal:     Cervical back: Normal range of motion and neck supple.  Skin:    General: Skin is warm and dry.  Neurological:     Comments: Motor 5/5 all extremities Sensation grossly intact to LT all extremities Finger to Nose intact, no pronator drift CN II-XII grossly intact       (all labs ordered are listed, but only abnormal results are displayed) Labs Reviewed - No data to display  EKG: None  Radiology: No results found.   Procedures   Medications Ordered in the ED - No data to display                                  Medical Decision Making Risk Prescription drug management.   This patient presents to the ED for concern of facial pain, this involves an extensive number of treatment options, and is a complaint that carries with it a high risk of complications and morbidity.  I considered the following differential and admission for this acute, potentially life threatening condition.  The differential diagnosis includes TMJ, otitis media, otitis externa, mastoiditis, dental  infection, venous cavernous sinus thrombosis, intracranial hemorrhage, meningitis, shingles, carotid dissection  MDM:    Patient is a 23 year old who presents with pain radiating from her ear.  She currently does not have any symptoms although she had a little bit of tenderness on palpation of her upper left paracervical area.  No current headache or other pain.  She is that lasted few minutes.  Given no symptoms, would doubt intracranial hemorrhage, carotid dissection or venous sinus thrombosis.  She does not have symptoms that would be more concerning for meningitis.  I do not see any  overlying rash.  I do not think that she needs any current imaging given that she is asymptomatic.  The way she is describing it sounds like neuropathic pain.  Will try put her on a short course of prednisone .  She does not appear to have any pregnancy related complaints.  Her blood pressure is good.  No other concerns for preeclampsia.  She was discharged home in good condition.  Was encouraged to have close follow-up with her OB/GYN.  Return precautions were given.  (Labs, imaging, consults)  Labs: I Ordered, and personally interpreted labs.  The pertinent results include:     Imaging Studies ordered: I ordered imaging studies including none I independently visualized and interpreted imaging. I agree with the radiologist interpretation  Additional history obtained from chart.  External records from outside source obtained and reviewed including prior notes  Cardiac Monitoring: The patient was not maintained on a cardiac monitor.  If on the cardiac monitor, I personally viewed and interpreted the cardiac monitored which showed an underlying rhythm of:    Reevaluation: After the interventions noted above, I reevaluated the patient and found that they have :improved  Social Determinants of Health:    Disposition: Discharged to home  Co morbidities that complicate the patient evaluation  Past Medical  History:  Diagnosis Date   Abdominal pain 01/17/2015   Abnormal bowel movement 03/11/2023   Acanthosis nigricans, acquired    Acid reflux    Acne 06/14/2015   Allergic rhinitis 03/24/2012   Allergy    Anxiety    Asthma    prn inhaler   Bipolar and related disorder (HCC) 12/17/2015   BRBPR (bright red blood per rectum) 07/19/2019   Chronic constipation 03/24/2012   Cyst of ovary, left    Decreased visual acuity 02/27/2015   Depression    Dyspareunia, female 07/10/2022   Dyspepsia    no current med.   Dysphagia 04/21/2012   Dyspnea on exertion 01/18/2023   Patient recently started exercising and reports getting SOB with exertion. She thinks this is likely related to her weight/deconditioning. Hx of asthma but she does not think albuterol  helps her symptoms.     Eczema    Goiter 12/14/2011   Herpes 2018   Insomnia 03/04/2016   Irregular menses 02/22/2017   Isosexual precocity    Obesity    Oppositional defiant disorder    Post traumatic stress disorder    Post-operative nausea and vomiting    PTSD (post-traumatic stress disorder) 02/02/2015   Routine adult health maintenance 06/12/2023   Seasonal allergies    Sore throat 05/14/2023   Soy allergy 04/29/2012   Vaginismus 06/05/2022   Vitamin D  deficiency 02/24/2019   Weight loss 02/24/2019     Medicines Meds ordered this encounter  Medications   predniSONE  (DELTASONE ) 20 MG tablet    Sig: 3 tabs po day one, then 2 tabs daily x 4 days    Dispense:  11 tablet    Refill:  0    I have reviewed the patients home medicines and have made adjustments as needed  Problem List / ED Course: Problem List Items Addressed This Visit   None Visit Diagnoses       Facial pain    -  Primary                Final diagnoses:  Facial pain    ED Discharge Orders          Ordered    predniSONE  (DELTASONE ) 20  MG tablet        03/30/24 1754               Lenor Hollering, MD 03/30/24 1811

## 2024-03-30 NOTE — ED Triage Notes (Signed)
 Patient arrives POV with complaints of intermittent facial/headache pain radiating to her the back of her head x2 weeks. Rates pain a 4/10. Patient is also ~7 months pregnant. No pregnancy complaints.

## 2024-04-02 ENCOUNTER — Encounter (HOSPITAL_COMMUNITY): Payer: Self-pay | Admitting: Obstetrics & Gynecology

## 2024-04-02 ENCOUNTER — Inpatient Hospital Stay (HOSPITAL_COMMUNITY)
Admission: AD | Admit: 2024-04-02 | Discharge: 2024-04-02 | Disposition: A | Discharge status: Home / Self Care | Payer: MEDICAID | Attending: Obstetrics & Gynecology | Admitting: Obstetrics & Gynecology

## 2024-04-02 ENCOUNTER — Other Ambulatory Visit: Payer: Self-pay

## 2024-04-02 DIAGNOSIS — G43909 Migraine, unspecified, not intractable, without status migrainosus: Secondary | ICD-10-CM

## 2024-04-02 DIAGNOSIS — O26893 Other specified pregnancy related conditions, third trimester: Secondary | ICD-10-CM

## 2024-04-02 DIAGNOSIS — Z3A33 33 weeks gestation of pregnancy: Secondary | ICD-10-CM | POA: Diagnosis not present

## 2024-04-02 DIAGNOSIS — O99353 Diseases of the nervous system complicating pregnancy, third trimester: Secondary | ICD-10-CM | POA: Diagnosis not present

## 2024-04-02 LAB — URINALYSIS, ROUTINE W REFLEX MICROSCOPIC
Bilirubin Urine: NEGATIVE
Glucose, UA: NEGATIVE mg/dL
Hgb urine dipstick: NEGATIVE
Ketones, ur: NEGATIVE mg/dL
Nitrite: NEGATIVE
Protein, ur: NEGATIVE mg/dL
Specific Gravity, Urine: 1.011 (ref 1.005–1.030)
pH: 8 (ref 5.0–8.0)

## 2024-04-02 MED ORDER — CYCLOBENZAPRINE HCL 5 MG PO TABS
10.0000 mg | ORAL_TABLET | Freq: Once | ORAL | Status: AC
  Administered 2024-04-02: 10 mg via ORAL
  Filled 2024-04-02: qty 2

## 2024-04-02 MED ORDER — DIPHENHYDRAMINE HCL 50 MG/ML IJ SOLN
25.0000 mg | Freq: Once | INTRAMUSCULAR | Status: AC
  Administered 2024-04-02: 25 mg via INTRAVENOUS
  Filled 2024-04-02: qty 1

## 2024-04-02 MED ORDER — METOCLOPRAMIDE HCL 5 MG/ML IJ SOLN
10.0000 mg | Freq: Once | INTRAMUSCULAR | Status: AC
  Administered 2024-04-02: 10 mg via INTRAVENOUS
  Filled 2024-04-02: qty 2

## 2024-04-02 MED ORDER — LACTATED RINGERS IV BOLUS
1000.0000 mL | Freq: Once | INTRAVENOUS | Status: AC
  Administered 2024-04-02: 1000 mL via INTRAVENOUS

## 2024-04-02 MED ORDER — ACETAMINOPHEN-CAFFEINE 500-65 MG PO TABS
1.0000 | ORAL_TABLET | Freq: Once | ORAL | Status: AC
  Administered 2024-04-02: 1 via ORAL
  Filled 2024-04-02: qty 1

## 2024-04-02 NOTE — MAU Note (Signed)
..  Deanna Mckay is a 23 y.o. at [redacted]w[redacted]d here in MAU reporting: headache on the left side of her head that radiates into her eye and her ear. She went to [facility] recently and was prescribed prednisone  for these symptoms and she states the pain is not improving. She also reports tingling sensations in her left hand as well. Denies visual disturbances. Denies VB or LOF. +FM. Last took 500mg  tylenol  around 1200 and it did not help.   Pain score: 7 Vitals:   04/02/24 1755  BP: 123/87  Pulse: 85  Resp: 15  Temp: 98.8 F (37.1 C)  SpO2: 100%     FHT:144 Lab orders placed from triage:   UA

## 2024-04-02 NOTE — Discharge Instructions (Signed)
    [facility]     [facility]                    [facility]                 [facility]    **Purchase ONE of these store-brand Excedrin Tension Headache medications. Take it as directed for relief of headache. DO NOT TAKE AT THE SAME TIME AS TYLENOL /ACETAMINOPHEN **

## 2024-04-02 NOTE — MAU Provider Note (Cosign Needed Addendum)
 History     CSN: [id number]  Arrival date and time: 04/02/24 1732   Event Date/Time   First Provider Initiated Contact with Patient 04/02/24 1831      Chief Complaint  Patient presents with   Headache   HPI Deanna Mckay is a 23 y.o. G1P0000 at [redacted]w[redacted]d who presents with headache. Reports persistent headache x 2 weeks. Went to [facility] ED on Thursday; no meds given in ED but was prescribed prednisone . States she's been taking prednisone  & tylenol  - initially has some relief but headache returns. Last took prednisone  this morning. Took 500 mg of tylenol  at 3 pm.  Reports left sided headache. Worse with sounds & cold air. Consistent with migraines she got prior to pregnancy.  Denies LOC, n/v, abdominal pain, vaginal bleeding. Reports good fetal movement.    Past Medical History:  Diagnosis Date   Acanthosis nigricans, acquired    Acne 06/14/2015   Anxiety    Asthma    prn inhaler   Bipolar and related disorder (HCC) 12/17/2015   BRBPR (bright red blood per rectum) 07/19/2019   Chronic constipation 03/24/2012   Cyst of ovary, left    Decreased visual acuity 02/27/2015   Depression    Dyspareunia, female 07/10/2022   Dyspepsia    no current med.   Dysphagia 04/21/2012   Dyspnea on exertion 01/18/2023   Patient recently started exercising and reports getting SOB with exertion. She thinks this is likely related to her weight/deconditioning. Hx of asthma but she does not think albuterol  helps her symptoms.     Eczema    Goiter 12/14/2011   Herpes 2018   Insomnia 03/04/2016   Obesity    Oppositional defiant disorder    Post-operative nausea and vomiting    PTSD (post-traumatic stress disorder) 02/02/2015   Seasonal allergies    Soy allergy 04/29/2012   Vaginismus 06/05/2022    Past Surgical History:  Procedure Laterality Date   CLOSED REDUCTION AND PERCUTANEOUS PINNING OF HUMERUS FRACTURE Right 10/31/2005   supracondylar humerus fx.   CYST EXCISION Right 07/11/2002    temple area   MINOR SUPPRELIN  REMOVAL Left 01/11/2014   Procedure: REMOVAL OF SUPPRELIN  IMPLANT IN LEFT UPPER EXTREMITY;  Surgeon: CHRISTELLA. Julietta Millman, MD;  Location: [facility];  Service: Pediatrics;  Laterality: Left;   MOUTH SURGERY     SUPPRELIN  IMPLANT  01/14/2012   Procedure: SUPPRELIN  IMPLANT;  Surgeon: CHRISTELLA. Julietta Millman, MD;  Location: [facility];  Service: Pediatrics;  Laterality: Left;   TOENAIL EXCISION Right 03/19/2008   great toe    Family History  Problem Relation Age of Onset   Stroke Mother    Asthma Mother    Depression Mother    Hypertension Father    Heart disease Father    Asthma Father    Eczema Father    Cancer Maternal Aunt    Cancer Paternal Grandfather     Social History   Tobacco Use   Smoking status: Never   Smokeless tobacco: Never  Vaping Use   Vaping status: Never Used  Substance Use Topics   Alcohol use: Not Currently    Comment: rarely will do wine   Drug use: Not Currently    Types: Marijuana    Allergies:  Allergies  Allergen Reactions   Other Shortness Of Breath and Other (See Comments)    Any type of BEANS exacerbate the patient's asthma   Trichophyton Shortness Of Breath and Other (See Comments)  Wheezing  Wheezing   Midazolam  Hcl Nausea And Vomiting   Penicillins Hives and Rash    Has patient had a PCN reaction causing immediate rash, facial/tongue/throat swelling, SOB or lightheadedness with hypotension: Yes Has patient had a PCN reaction causing severe rash involving mucus membranes or skin necrosis: No Has patient had a PCN reaction that required hospitalization: No Has patient had a PCN reaction occurring within the last 10 years: No If all of the above answers are NO, then may proceed with Cephalosporin use. Has patient had a PCN reaction causing immediate rash, facial/tongue/throat swelling, SOB or lightheadedness with hypotension: Yes Has patient had a PCN reaction causing severe rash  involving mucus membranes or skin necrosis: No Has patient had a PCN reaction that required hospitalization: No Has patient had a PCN reaction occurring within the last 10 years: No If all of the above answers are NO, then may proceed with Cephalosporin use.   Soy Allergy (Obsolete) Other (See Comments)    WHEEZING/EXACERBATES ASTHMA WHEEZING/EXACERBATES ASTHMA   Versed  [Midazolam  Hcl] Nausea And Vomiting   Ranitidine  Hcl Rash   Zantac  [Ranitidine  Hcl] Rash    Medications Prior to Admission  Medication Sig Dispense Refill Last Dose/Taking   ARIPiprazole  (ABILIFY ) 20 MG tablet Take 20 mg by mouth daily.   Taking   Cholecalciferol (VITAMIN D3) 50 MCG (2000 UT) capsule Take 1 capsule (2,000 Units total) by mouth daily. 120 capsule 0 04/02/2024   Ferric Maltol  (ACCRUFER ) 30 MG CAPS Take 1 capsule (30 mg total) by mouth daily. 30 capsule 3 04/02/2024   predniSONE  (DELTASONE ) 20 MG tablet 3 tabs po day one, then 2 tabs daily x 4 days 11 tablet 0 04/02/2024   sertraline (ZOLOFT) 25 MG tablet Take 25 mg by mouth daily.   04/01/2024   valACYclovir  (VALTREX ) 500 MG tablet Take 1 tablet (500 mg total) by mouth daily. 30 tablet 1 04/02/2024   albuterol  (VENTOLIN  HFA) 108 (90 Base) MCG/ACT inhaler Inhale 2 puffs into the lungs every 6 (six) hours as needed for wheezing or shortness of breath. 8 g 2    Blood Pressure Monitoring (BLOOD PRESSURE KIT) DEVI 1 Device by Does not apply route once a week. 1 each 0    EPINEPHrine  (EPIPEN  2-PAK) 0.3 mg/0.3 mL IJ SOAJ injection Inject 0.3 mLs (0.3 mg total) into the muscle as needed for anaphylaxis (if you use this, you must call 911 immediately). 1 each 1    hydrocortisone  cream 1 % Apply 1 application topically 2 (two) times daily as needed (eczema). 30 g 0    loratadine  (CLARITIN ) 10 MG tablet Take 1 tablet (10 mg total) by mouth daily. 30 tablet 11    omeprazole  (PRILOSEC) 20 MG capsule TAKE 1 CAPSULE(20 MG) BY MOUTH DAILY 30 capsule 3    polyethylene glycol  powder (GLYCOLAX /MIRALAX ) 17 GM/SCOOP powder Take 17 g by mouth as needed for moderate constipation, mild constipation or severe constipation.      Prenat-Fe Poly-Methfol-FA-DHA (VITAFOL  ULTRA) 29-0.6-0.4-200 MG CAPS Take 1 capsule by mouth daily before breakfast. 90 capsule 4    Prenatal Vit-Fe Fumarate-FA (PRENATAL VITAMIN PO) Take 1 tablet by mouth daily. (Patient not taking: Reported on 12/07/2023)      terconazole  (TERAZOL 7 ) 0.4 % vaginal cream Place 1 applicator vaginally at bedtime. (Patient not taking: Reported on 02/15/2024) 45 g 0     Review of Systems  All other systems reviewed and are negative.  Physical Exam   Blood pressure 123/87, pulse 85, temperature  98.8 F (37.1 C), temperature source Oral, resp. rate 15, height 5' 4 (1.626 m), weight 95.9 kg, last menstrual period 08/11/2023, SpO2 100%.  Physical Exam Vitals and nursing note reviewed.  Constitutional:      General: She is not in acute distress.    Appearance: She is well-developed. She is not ill-appearing.  HENT:     Head: Normocephalic and atraumatic.  Eyes:     General: No scleral icterus.       Right eye: No discharge.        Left eye: No discharge.     Conjunctiva/sclera: Conjunctivae normal.  Pulmonary:     Effort: Pulmonary effort is normal. No respiratory distress.  Neurological:     General: No focal deficit present.     Mental Status: She is alert.  Psychiatric:        Mood and Affect: Mood normal.        Behavior: Behavior normal.    NST:  Baseline: 140 bpm, Variability: Good {> 6 bpm), Accelerations: Reactive, and Decelerations: Absent  MAU Course  Procedures Results for orders placed or performed during the hospital encounter of 04/02/24 (from the past 24 hours)  Urinalysis, Routine w reflex microscopic -Urine, Clean Catch     Status: Abnormal   Collection Time: 04/02/24  5:59 PM  Result Value Ref Range   Color, Urine YELLOW YELLOW   APPearance HAZY (A) CLEAR   Specific Gravity, Urine  1.011 1.005 - 1.030   pH 8.0 5.0 - 8.0   Glucose, UA NEGATIVE NEGATIVE mg/dL   Hgb urine dipstick NEGATIVE NEGATIVE   Bilirubin Urine NEGATIVE NEGATIVE   Ketones, ur NEGATIVE NEGATIVE mg/dL   Protein, ur NEGATIVE NEGATIVE mg/dL   Nitrite NEGATIVE NEGATIVE   Leukocytes,Ua SMALL (A) NEGATIVE   RBC / HPF 0-5 0 - 5 RBC/hpf   WBC, UA 0-5 0 - 5 WBC/hpf   Bacteria, UA RARE (A) NONE SEEN   Squamous Epithelial / HPF 6-10 0 - 5 /HPF   Mucus PRESENT     MDM  Presents with headache consistent with hx of migraines. Normotensive Given excedrin tension & flexeril . Reports headache down to 4/10 - requesting additional medication  IV fluids, reglan , & benadryl  ordered  Care turned over to [facility] CNM [place] 04/02/2024 8:13 PM   Reassessment @ 2240: Feeling better and ready to go home  Assessment and Plan  1. Migraine without status migrainosus, not intractable, unspecified migraine type (Primary) - Advised to purchase OTC Excedrin Tension H/A and take as directed  2. [redacted] weeks gestation of pregnancy   - Discharge home  - Keep scheduled appt with [facility] on 04/05/2024 - Patient verbalized an understanding of the plan of care and agrees.   Ala Cart, CNM  04/02/2024 8:19 PM

## 2024-04-05 ENCOUNTER — Ambulatory Visit (INDEPENDENT_AMBULATORY_CARE_PROVIDER_SITE_OTHER): Payer: MEDICAID | Admitting: Physician Assistant

## 2024-04-05 ENCOUNTER — Encounter: Payer: Self-pay | Admitting: Physician Assistant

## 2024-04-05 VITALS — BP 121/86 | HR 81 | Wt 220.6 lb

## 2024-04-05 DIAGNOSIS — O0992 Supervision of high risk pregnancy, unspecified, second trimester: Secondary | ICD-10-CM

## 2024-04-05 DIAGNOSIS — Z3A34 34 weeks gestation of pregnancy: Secondary | ICD-10-CM | POA: Diagnosis not present

## 2024-04-05 DIAGNOSIS — R519 Headache, unspecified: Secondary | ICD-10-CM | POA: Diagnosis not present

## 2024-04-05 DIAGNOSIS — G8929 Other chronic pain: Secondary | ICD-10-CM | POA: Diagnosis not present

## 2024-04-05 NOTE — Progress Notes (Unsigned)
 Pt states she went to the ED and the MAU over this past weekend due to headaches. Pt states they took her off of prednisone . Pt states she is still experiencing headaches.

## 2024-04-05 NOTE — Patient Instructions (Addendum)
 Excedrin Tension over the counter for headaches. You may take up to 3,000 mg Tylenol .

## 2024-04-06 NOTE — Progress Notes (Signed)
   PRENATAL VISIT NOTE  Subjective:  Deanna Mckay is a 23 y.o. G1P0000 at [redacted]w[redacted]d being seen today for ongoing prenatal care.  She is currently monitored for the following issues for this low-risk pregnancy and has Obesity; Asthma; Generalized anxiety disorder; Major depression, recurrent (HCC); GERD (gastroesophageal reflux disease); Genital herpes; Dermoid cyst of left ovary; Supervision of high risk pregnancy, antepartum, second trimester; Rubella non-immune status, antepartum; Obesity affecting pregnancy, antepartum; Echogenic focus of heart of fetus affecting antepartum care of mother, single gestation; and Fetal renal anomaly, single gestation (mild UTD) on their problem list.  Patient reports intermittent headache, similar in quality to previous migraines before pregnancy. ED and MAU visit for this 7/17 and 7/20, respectively. Prednisone  given at ED ineffective. She has not tried Excedrin tension recommended at MAU.   Patient denies fever, n/v, weakness, neck stiffness, photophobia, phonophobia, visual changes, numbness, loss of coordination, AMS.    Contractions: Irritability. Vag. Bleeding: None.  Movement: Increased. Denies leaking of fluid.   The following portions of the patient's history were reviewed and updated as appropriate: allergies, current medications, past family history, past medical history, past social history, past surgical history and problem list.   Objective:    Vitals:   04/05/24 1637  BP: 121/86  Pulse: 81  Weight: 220 lb 9.6 oz (100.1 kg)    Fetal Status:  Fetal Heart Rate (bpm): 134 Fundal Height: 34 cm Movement: Increased    General: Alert, oriented and cooperative. Patient is in no acute distress.  Skin: Skin is warm and dry. No rash noted.   Cardiovascular: Normal heart rate noted  Respiratory: Normal respiratory effort, no problems with respiration noted  Abdomen: Soft, gravid, appropriate for gestational age.  Pain/Pressure: Absent     Pelvic:  Cervical exam deferred        Extremities: Normal range of motion.  Edema: Trace  Mental Status: Normal mood and affect. Normal behavior. Normal judgment and thought content.   Assessment and Plan:  Pregnancy: G1P0000 at [redacted]w[redacted]d  1. Supervision of high risk pregnancy, antepartum, second trimester (Primary) Patient doing well, feeling regular fetal movement  BP, FHR, FH appropriate  2. [redacted] weeks gestation of pregnancy Anticipatory guidance about next visits/weeks of pregnancy given.   3. Chronic nonintractable headache Patient with history of migraine headaches, now worsened in pregnancy. No red flag signs/sxs. Normotensive without signs/sxs preE. Patient would like to try increased dosing of Tylenol  and Excedrin tension before muscle relaxant that was offered, which I think is reasonable. We discussed maximum safe dose of acetaminophen  in pregnancy, to which she voiced understanding. She is amenable to seeing neurology for long-term maintenance of this issue. Return precautions given.   Preterm labor symptoms and general obstetric precautions including but not limited to vaginal bleeding, contractions, leaking of fluid and fetal movement were reviewed in detail with the patient.  Please refer to After Visit Summary for other counseling recommendations.   No follow-ups on file.  Future Appointments  Date Time Provider Department Center  04/17/2024  4:10 PM Delores Nidia CROME, FNP [facility] None    Jorene FORBES Moats, PA-C

## 2024-04-09 ENCOUNTER — Encounter: Payer: Self-pay | Admitting: Family Medicine

## 2024-04-17 ENCOUNTER — Encounter: Payer: Self-pay | Admitting: Obstetrics and Gynecology

## 2024-04-17 ENCOUNTER — Ambulatory Visit (INDEPENDENT_AMBULATORY_CARE_PROVIDER_SITE_OTHER): Payer: MEDICAID | Admitting: Obstetrics and Gynecology

## 2024-04-17 VITALS — BP 134/86 | HR 84 | Wt 225.8 lb

## 2024-04-17 DIAGNOSIS — O0992 Supervision of high risk pregnancy, unspecified, second trimester: Secondary | ICD-10-CM

## 2024-04-17 DIAGNOSIS — Z3A35 35 weeks gestation of pregnancy: Secondary | ICD-10-CM

## 2024-04-17 DIAGNOSIS — R519 Headache, unspecified: Secondary | ICD-10-CM

## 2024-04-17 DIAGNOSIS — O26893 Other specified pregnancy related conditions, third trimester: Secondary | ICD-10-CM | POA: Diagnosis not present

## 2024-04-17 DIAGNOSIS — G8929 Other chronic pain: Secondary | ICD-10-CM

## 2024-04-17 DIAGNOSIS — R12 Heartburn: Secondary | ICD-10-CM

## 2024-04-17 MED ORDER — OMEPRAZOLE MAGNESIUM 20 MG PO TBEC: 20.0000 mg | DELAYED_RELEASE_TABLET | Freq: Every day | ORAL | 2 refills | Status: DC

## 2024-04-17 NOTE — Progress Notes (Signed)
   PRENATAL VISIT NOTE  Subjective:  Deanna Mckay is a 23 y.o. G1P0000 at [redacted]w[redacted]d being seen today for ongoing prenatal care.  She is currently monitored for the following issues for this high-risk pregnancy and has Obesity; Asthma; Generalized anxiety disorder; Major depression, recurrent (HCC); GERD (gastroesophageal reflux disease); Genital herpes; Dermoid cyst of left ovary; Supervision of high risk pregnancy, antepartum, second trimester; Rubella non-immune status, antepartum; Obesity affecting pregnancy, antepartum; Echogenic focus of heart of fetus affecting antepartum care of mother, single gestation; and Fetal renal anomaly, single gestation (mild UTD) on their problem list.  Patient reports swelling feet, heart burn  .  Contractions: Not present. Vag. Bleeding: None.  Movement: Present. Denies leaking of fluid.   The following portions of the patient's history were reviewed and updated as appropriate: allergies, current medications, past family history, past medical history, past social history, past surgical history and problem list.   Objective:    Vitals:   04/17/24 1616  BP: 134/86  Pulse: 84  Weight: 225 lb 12.8 oz (102.4 kg)    Fetal Status:  Fetal Heart Rate (bpm): 132   Movement: Present    General: Alert, oriented and cooperative. Patient is in no acute distress.  Skin: Skin is warm and dry. No rash noted.   Cardiovascular: Normal heart rate noted  Respiratory: Normal respiratory effort, no problems with respiration noted  Abdomen: Soft, gravid, appropriate for gestational age.  Pain/Pressure: Present     Pelvic: Cervical exam deferred        Extremities: Normal range of motion.  Edema: Trace  Mental Status: Normal mood and affect. Normal behavior. Normal judgment and thought content.   Assessment and Plan:  Pregnancy: G1P0000 at [redacted]w[redacted]d 1. Supervision of high risk pregnancy, antepartum, second trimester (Primary) BP and FHR normal Doing well, feeling regular  movement    2. Chronic nonintractable headache, unspecified headache type Discussed medication, did not tolerate excedrine She reports she has been taking 1 tylenol , discussed increase to 2 regular strength tylenol  or can try 2 extra strength Discussed mag oxide 400mg  qs  3. [redacted] weeks gestation of pregnancy Swabs next week  Discussed swelling, and other signs and symptoms, compression socks, elevation, continue monitor BP     4. Heartburn during pregnancy in third trimester Discussed dietary changes and upright 20-30 min after dinner, omeprazole  if needed - omeprazole  (PRILOSEC  OTC) 20 MG tablet; Take 1 tablet (20 mg total) by mouth daily.  Dispense: 30 tablet; Refill: 2  Preterm labor symptoms and general obstetric precautions including but not limited to vaginal bleeding, contractions, leaking of fluid and fetal movement were reviewed in detail with the patient. Please refer to After Visit Summary for other counseling recommendations.   Return in about 1 week (around 04/24/2024) for OB VISIT (MD or APP).  Future Appointments  Date Time Provider Department Center  04/27/2024  2:50 PM Delores Nidia CROME, FNP [facility] None  08/29/2024  2:45 PM Buck Saucer, MD [facility] None    Nidia Delores, FNP

## 2024-04-17 NOTE — Progress Notes (Signed)
 Pt presents for ROB visit. Pt c/o swelling and pain in the legs and feet.

## 2024-04-27 ENCOUNTER — Encounter: Payer: Self-pay | Admitting: Obstetrics and Gynecology

## 2024-04-27 ENCOUNTER — Other Ambulatory Visit (HOSPITAL_COMMUNITY)
Admission: RE | Admit: 2024-04-27 | Discharge: 2024-04-27 | Disposition: A | Discharge status: Home / Self Care | Payer: MEDICAID | Source: Ambulatory Visit | Attending: Obstetrics and Gynecology | Admitting: Obstetrics and Gynecology

## 2024-04-27 ENCOUNTER — Ambulatory Visit: Payer: MEDICAID | Admitting: Obstetrics and Gynecology

## 2024-04-27 VITALS — BP 133/88 | HR 80 | Wt 239.0 lb

## 2024-04-27 DIAGNOSIS — O0992 Supervision of high risk pregnancy, unspecified, second trimester: Secondary | ICD-10-CM

## 2024-04-27 DIAGNOSIS — Z3A37 37 weeks gestation of pregnancy: Secondary | ICD-10-CM | POA: Insufficient documentation

## 2024-04-27 DIAGNOSIS — Z3493 Encounter for supervision of normal pregnancy, unspecified, third trimester: Secondary | ICD-10-CM | POA: Insufficient documentation

## 2024-04-27 NOTE — Progress Notes (Signed)
 Pt presents for ROB visit. Pt c/o swelling and pain in the legs and feet and tingling in the hands

## 2024-04-27 NOTE — Progress Notes (Signed)
   PRENATAL VISIT NOTE  Subjective:  Deanna Mckay is a 23 y.o. G1P0000 at [redacted]w[redacted]d being seen today for ongoing prenatal care.  She is currently monitored for the following issues for this low-risk pregnancy and has Obesity; Asthma; Generalized anxiety disorder; Major depression, recurrent (HCC); GERD (gastroesophageal reflux disease); Genital herpes; Dermoid cyst of left ovary; Supervision of high risk pregnancy, antepartum, second trimester; Rubella non-immune status, antepartum; Obesity affecting pregnancy, antepartum; Echogenic focus of heart of fetus affecting antepartum care of mother, single gestation; and Fetal renal anomaly, single gestation (mild UTD) on their problem list.  Patient reports swelling feet and tingling hand .  Contractions: Irritability. Vag. Bleeding: None.  Movement: Present. Denies leaking of fluid.   The following portions of the patient's history were reviewed and updated as appropriate: allergies, current medications, past family history, past medical history, past social history, past surgical history and problem list.   Objective:    Vitals:   04/27/24 1501  BP: 133/88  Pulse: 80  Weight: 239 lb (108.4 kg)    Fetal Status:  Fetal Heart Rate (bpm): 132 Fundal Height: 38 cm Movement: Present Presentation: Vertex  General: Alert, oriented and cooperative. Patient is in no acute distress.  Skin: Skin is warm and dry. No rash noted.   Cardiovascular: Normal heart rate noted  Respiratory: Normal respiratory effort, no problems with respiration noted  Abdomen: Soft, gravid, appropriate for gestational age.  Pain/Pressure: Absent     Pelvic: Cervical exam performed in the presence of a chaperone Dilation: Closed Effacement (%): Thick    Extremities: Normal range of motion.  Edema: Moderate pitting, indentation subsides rapidly  Mental Status: Normal mood and affect. Normal behavior. Normal judgment and thought content.   Assessment and Plan:  Pregnancy:  G1P0000 at [redacted]w[redacted]d 1. Supervision of high risk pregnancy, antepartum, second trimester (Primary) BP and FHR normal  2. [redacted] weeks gestation of pregnancy Labs and swabs today Labor precautions Monitor symptoms and bp  - Strep Gp B Culture+Rflx - Cervicovaginal ancillary only - CBC - Protein / creatinine ratio, urine - Comp Met (CMET)  Term labor symptoms and general obstetric precautions including but not limited to vaginal bleeding, contractions, leaking of fluid and fetal movement were reviewed in detail with the patient. Please refer to After Visit Summary for other counseling recommendations.   Return in about 1 week (around 05/04/2024) for OB VISIT (MD or APP).  Future Appointments  Date Time Provider Department Center  05/09/2024  3:50 PM Abigail, Rollo DASEN, MD [facility] None  08/29/2024  2:45 PM Buck Saucer, MD [facility] None    Nidia Daring, FNP

## 2024-04-28 ENCOUNTER — Ambulatory Visit: Payer: Self-pay | Admitting: Obstetrics and Gynecology

## 2024-04-28 LAB — CBC
Hematocrit: 31.7 % — ABNORMAL LOW (ref 34.0–46.6)
Hemoglobin: 10 g/dL — ABNORMAL LOW (ref 11.1–15.9)
MCH: 31 pg (ref 26.6–33.0)
MCHC: 31.5 g/dL (ref 31.5–35.7)
MCV: 98 fL — ABNORMAL HIGH (ref 79–97)
Platelets: 300 x10E3/uL (ref 150–450)
RBC: 3.23 x10E6/uL — ABNORMAL LOW (ref 3.77–5.28)
RDW: 14.6 % (ref 11.7–15.4)
WBC: 7 x10E3/uL (ref 3.4–10.8)

## 2024-04-28 LAB — COMPREHENSIVE METABOLIC PANEL WITH GFR
ALT: 19 IU/L (ref 0–32)
AST: 23 IU/L (ref 0–40)
Albumin: 3.4 g/dL — ABNORMAL LOW (ref 4.0–5.0)
Alkaline Phosphatase: 163 IU/L — ABNORMAL HIGH (ref 44–121)
BUN/Creatinine Ratio: 8 — ABNORMAL LOW (ref 9–23)
BUN: 6 mg/dL (ref 6–20)
Bilirubin Total: <0.2 mg/dL (ref 0.0–1.2)
CO2: 19 mmol/L — ABNORMAL LOW (ref 20–29)
Calcium: 8.8 mg/dL (ref 8.7–10.2)
Chloride: 105 mmol/L (ref 96–106)
Creatinine, Ser: 0.79 mg/dL (ref 0.57–1.00)
Globulin, Total: 2.4 g/dL (ref 1.5–4.5)
Glucose: 90 mg/dL (ref 70–99)
Potassium: 4 mmol/L (ref 3.5–5.2)
Sodium: 137 mmol/L (ref 134–144)
Total Protein: 5.8 g/dL — ABNORMAL LOW (ref 6.0–8.5)
eGFR: 108 mL/min/1.73 (ref >59)

## 2024-04-29 LAB — PROTEIN / CREATININE RATIO, URINE
Creatinine, Urine: 128.9 mg/dL
Protein, Ur: 16.8 mg/dL
Protein/Creat Ratio: 130 mg/g{creat} (ref 0–200)

## 2024-04-30 ENCOUNTER — Inpatient Hospital Stay (HOSPITAL_COMMUNITY)
Admission: AD | Admit: 2024-04-30 | Discharge: 2024-05-04 | DRG: 806 | Disposition: A | Discharge status: Home / Self Care | Payer: MEDICAID | Attending: Obstetrics and Gynecology | Admitting: Obstetrics and Gynecology

## 2024-04-30 ENCOUNTER — Encounter (HOSPITAL_COMMUNITY): Payer: Self-pay | Admitting: Obstetrics and Gynecology

## 2024-04-30 ENCOUNTER — Other Ambulatory Visit: Payer: Self-pay

## 2024-04-30 DIAGNOSIS — O99214 Obesity complicating childbirth: Secondary | ICD-10-CM | POA: Diagnosis present

## 2024-04-30 DIAGNOSIS — E66813 Obesity, class 3: Secondary | ICD-10-CM | POA: Diagnosis present

## 2024-04-30 DIAGNOSIS — F319 Bipolar disorder, unspecified: Secondary | ICD-10-CM | POA: Diagnosis present

## 2024-04-30 DIAGNOSIS — O134 Gestational [pregnancy-induced] hypertension without significant proteinuria, complicating childbirth: Secondary | ICD-10-CM | POA: Diagnosis present

## 2024-04-30 DIAGNOSIS — O9962 Diseases of the digestive system complicating childbirth: Secondary | ICD-10-CM | POA: Diagnosis present

## 2024-04-30 DIAGNOSIS — O9049 Other postpartum acute kidney failure: Secondary | ICD-10-CM | POA: Diagnosis present

## 2024-04-30 DIAGNOSIS — Z8759 Personal history of other complications of pregnancy, childbirth and the puerperium: Secondary | ICD-10-CM | POA: Diagnosis present

## 2024-04-30 DIAGNOSIS — Z2839 Other underimmunization status: Secondary | ICD-10-CM

## 2024-04-30 DIAGNOSIS — K219 Gastro-esophageal reflux disease without esophagitis: Secondary | ICD-10-CM | POA: Diagnosis present

## 2024-04-30 DIAGNOSIS — F4310 Post-traumatic stress disorder, unspecified: Secondary | ICD-10-CM | POA: Diagnosis present

## 2024-04-30 DIAGNOSIS — A6000 Herpesviral infection of urogenital system, unspecified: Secondary | ICD-10-CM | POA: Diagnosis present

## 2024-04-30 DIAGNOSIS — O09899 Supervision of other high risk pregnancies, unspecified trimester: Secondary | ICD-10-CM

## 2024-04-30 DIAGNOSIS — Z8249 Family history of ischemic heart disease and other diseases of the circulatory system: Secondary | ICD-10-CM | POA: Diagnosis not present

## 2024-04-30 DIAGNOSIS — Z3490 Encounter for supervision of normal pregnancy, unspecified, unspecified trimester: Secondary | ICD-10-CM | POA: Diagnosis present

## 2024-04-30 DIAGNOSIS — Z880 Allergy status to penicillin: Secondary | ICD-10-CM | POA: Diagnosis not present

## 2024-04-30 DIAGNOSIS — O9832 Other infections with a predominantly sexual mode of transmission complicating childbirth: Secondary | ICD-10-CM | POA: Diagnosis present

## 2024-04-30 DIAGNOSIS — O9952 Diseases of the respiratory system complicating childbirth: Secondary | ICD-10-CM | POA: Diagnosis present

## 2024-04-30 DIAGNOSIS — Z3A37 37 weeks gestation of pregnancy: Secondary | ICD-10-CM | POA: Diagnosis not present

## 2024-04-30 DIAGNOSIS — O99013 Anemia complicating pregnancy, third trimester: Secondary | ICD-10-CM

## 2024-04-30 DIAGNOSIS — Z79899 Other long term (current) drug therapy: Secondary | ICD-10-CM | POA: Diagnosis not present

## 2024-04-30 DIAGNOSIS — O99344 Other mental disorders complicating childbirth: Secondary | ICD-10-CM | POA: Diagnosis present

## 2024-04-30 DIAGNOSIS — O1414 Severe pre-eclampsia complicating childbirth: Secondary | ICD-10-CM | POA: Diagnosis present

## 2024-04-30 DIAGNOSIS — O1413 Severe pre-eclampsia, third trimester: Principal | ICD-10-CM

## 2024-04-30 DIAGNOSIS — O139 Gestational [pregnancy-induced] hypertension without significant proteinuria, unspecified trimester: Secondary | ICD-10-CM | POA: Diagnosis present

## 2024-04-30 DIAGNOSIS — J4520 Mild intermittent asthma, uncomplicated: Secondary | ICD-10-CM | POA: Diagnosis present

## 2024-04-30 DIAGNOSIS — F411 Generalized anxiety disorder: Secondary | ICD-10-CM | POA: Diagnosis present

## 2024-04-30 DIAGNOSIS — O99213 Obesity complicating pregnancy, third trimester: Secondary | ICD-10-CM

## 2024-04-30 DIAGNOSIS — N179 Acute kidney failure, unspecified: Secondary | ICD-10-CM | POA: Diagnosis present

## 2024-04-30 LAB — GROUP B STREP BY PCR: Group B strep by PCR: NEGATIVE

## 2024-04-30 LAB — CBC
HCT: 31.1 % — ABNORMAL LOW (ref 36.0–46.0)
Hemoglobin: 10 g/dL — ABNORMAL LOW (ref 12.0–15.0)
MCH: 31 pg (ref 26.0–34.0)
MCHC: 32.2 g/dL (ref 30.0–36.0)
MCV: 96.3 fL (ref 80.0–100.0)
Platelets: 291 K/uL (ref 150–400)
RBC: 3.23 MIL/uL — ABNORMAL LOW (ref 3.87–5.11)
RDW: 15.6 % — ABNORMAL HIGH (ref 11.5–15.5)
WBC: 7.6 K/uL (ref 4.0–10.5)
nRBC: 0.3 % — ABNORMAL HIGH (ref 0.0–0.2)

## 2024-04-30 LAB — COMPREHENSIVE METABOLIC PANEL WITH GFR
ALT: 18 U/L (ref 0–44)
AST: 20 U/L (ref 15–41)
Albumin: 2.7 g/dL — ABNORMAL LOW (ref 3.5–5.0)
Alkaline Phosphatase: 161 U/L — ABNORMAL HIGH (ref 38–126)
Anion gap: 12 (ref 5–15)
BUN: 5 mg/dL — ABNORMAL LOW (ref 6–20)
CO2: 21 mmol/L — ABNORMAL LOW (ref 22–32)
Calcium: 8.8 mg/dL — ABNORMAL LOW (ref 8.9–10.3)
Chloride: 107 mmol/L (ref 98–111)
Creatinine, Ser: 0.84 mg/dL (ref 0.44–1.00)
GFR, Estimated: >60 mL/min (ref >60)
Glucose, Bld: 84 mg/dL (ref 70–99)
Potassium: 3.9 mmol/L (ref 3.5–5.1)
Sodium: 140 mmol/L (ref 135–145)
Total Bilirubin: 0.4 mg/dL (ref 0.0–1.2)
Total Protein: 6.4 g/dL — ABNORMAL LOW (ref 6.5–8.1)

## 2024-04-30 LAB — PROTEIN / CREATININE RATIO, URINE
Creatinine, Urine: 44 mg/dL
Total Protein, Urine: <6 mg/dL

## 2024-04-30 LAB — TYPE AND SCREEN
ABO/RH(D): B POS
Antibody Screen: NEGATIVE

## 2024-04-30 LAB — RPR: RPR Ser Ql: NONREACTIVE

## 2024-04-30 MED ORDER — TERBUTALINE SULFATE 1 MG/ML IJ SOLN: 0.2500 mg | Freq: Once | INTRAMUSCULAR | Status: DC | PRN

## 2024-04-30 MED ORDER — ACETAMINOPHEN 325 MG PO TABS
650.0000 mg | ORAL_TABLET | ORAL | Status: DC | PRN
  Administered 2024-04-30: 650 mg via ORAL
  Filled 2024-04-30: qty 2

## 2024-04-30 MED ORDER — OXYCODONE-ACETAMINOPHEN 5-325 MG PO TABS: 2.0000 | ORAL_TABLET | ORAL | Status: DC | PRN

## 2024-04-30 MED ORDER — OXYTOCIN BOLUS FROM INFUSION
333.0000 mL | Freq: Once | INTRAVENOUS | Status: AC
  Administered 2024-05-02: 333 mL via INTRAVENOUS

## 2024-04-30 MED ORDER — OXYTOCIN-SODIUM CHLORIDE 30-0.9 UT/500ML-% IV SOLN
1.0000 m[IU]/min | INTRAVENOUS | Status: DC
  Administered 2024-04-30: 2 m[IU]/min via INTRAVENOUS
  Filled 2024-04-30: qty 500

## 2024-04-30 MED ORDER — NIFEDIPINE 10 MG PO CAPS: 20.0000 mg | ORAL_CAPSULE | ORAL | Status: DC | PRN

## 2024-04-30 MED ORDER — FENTANYL CITRATE (PF) 100 MCG/2ML IJ SOLN: 50.0000 ug | INTRAMUSCULAR | Status: DC | PRN

## 2024-04-30 MED ORDER — MISOPROSTOL 50MCG HALF TABLET
50.0000 ug | ORAL_TABLET | Freq: Once | ORAL | Status: AC
  Administered 2024-04-30: 50 ug via ORAL
  Filled 2024-04-30: qty 1

## 2024-04-30 MED ORDER — OXYTOCIN-SODIUM CHLORIDE 30-0.9 UT/500ML-% IV SOLN
2.5000 [IU]/h | INTRAVENOUS | Status: DC
  Filled 2024-04-30: qty 500

## 2024-04-30 MED ORDER — HYDROXYZINE HCL 50 MG PO TABS: 50.0000 mg | ORAL_TABLET | Freq: Four times a day (QID) | ORAL | Status: DC | PRN

## 2024-04-30 MED ORDER — MISOPROSTOL 50MCG HALF TABLET
50.0000 ug | ORAL_TABLET | ORAL | Status: DC | PRN
  Administered 2024-04-30: 50 ug via BUCCAL
  Filled 2024-04-30 (×2): qty 1

## 2024-04-30 MED ORDER — SOD CITRATE-CITRIC ACID 500-334 MG/5ML PO SOLN
30.0000 mL | ORAL | Status: DC | PRN
  Administered 2024-05-01: 30 mL via ORAL
  Filled 2024-04-30: qty 30

## 2024-04-30 MED ORDER — LACTATED RINGERS IV SOLN: INTRAVENOUS | Status: DC

## 2024-04-30 MED ORDER — LIDOCAINE HCL (PF) 1 % IJ SOLN: 30.0000 mL | INTRAMUSCULAR | Status: DC | PRN

## 2024-04-30 MED ORDER — LACTATED RINGERS IV SOLN: 500.0000 mL | INTRAVENOUS | Status: DC | PRN

## 2024-04-30 MED ORDER — SODIUM CHLORIDE 0.9% FLUSH: 3.0000 mL | Freq: Two times a day (BID) | INTRAVENOUS | Status: DC

## 2024-04-30 MED ORDER — ONDANSETRON HCL 4 MG/2ML IJ SOLN: 4.0000 mg | Freq: Four times a day (QID) | INTRAMUSCULAR | Status: DC | PRN

## 2024-04-30 MED ORDER — MISOPROSTOL 25 MCG QUARTER TABLET
25.0000 ug | ORAL_TABLET | Freq: Once | ORAL | Status: AC
  Administered 2024-04-30: 25 ug via VAGINAL
  Filled 2024-04-30 (×2): qty 1

## 2024-04-30 MED ORDER — SODIUM CHLORIDE 0.9% FLUSH: 3.0000 mL | INTRAVENOUS | Status: DC | PRN

## 2024-04-30 MED ORDER — SODIUM CHLORIDE 0.9 % IV SOLN: 250.0000 mL | INTRAVENOUS | Status: AC | PRN

## 2024-04-30 MED ORDER — NIFEDIPINE 10 MG PO CAPS: 10.0000 mg | ORAL_CAPSULE | ORAL | Status: DC | PRN

## 2024-04-30 MED ORDER — OXYCODONE-ACETAMINOPHEN 5-325 MG PO TABS: 1.0000 | ORAL_TABLET | ORAL | Status: DC | PRN

## 2024-04-30 MED ORDER — LABETALOL HCL 5 MG/ML IV SOLN: 40.0000 mg | INTRAVENOUS | Status: DC | PRN

## 2024-04-30 NOTE — Plan of Care (Signed)
  Problem: Education: Goal: Knowledge of disease or condition will improve Outcome: Progressing Goal: Knowledge of the prescribed therapeutic regimen will improve Outcome: Progressing   Problem: Fluid Volume: Goal: Peripheral tissue perfusion will improve Outcome: Progressing   Problem: Clinical Measurements: Goal: Complications related to disease process, condition or treatment will be avoided or minimized Outcome: Progressing   Problem: Education: Goal: Knowledge of General Education information will improve Description: Including pain rating scale, medication(s)/side effects and non-pharmacologic comfort measures Outcome: Progressing   Problem: Health Behavior/Discharge Planning: Goal: Ability to manage health-related needs will improve Outcome: Progressing   Problem: Clinical Measurements: Goal: Ability to maintain clinical measurements within normal limits will improve Outcome: Progressing Goal: Will remain free from infection Outcome: Progressing Goal: Diagnostic test results will improve Outcome: Progressing Goal: Respiratory complications will improve Outcome: Progressing Goal: Cardiovascular complication will be avoided Outcome: Progressing   Problem: Activity: Goal: Risk for activity intolerance will decrease Outcome: Progressing   Problem: Nutrition: Goal: Adequate nutrition will be maintained Outcome: Progressing   Problem: Coping: Goal: Level of anxiety will decrease Outcome: Progressing   Problem: Elimination: Goal: Will not experience complications related to bowel motility Outcome: Progressing Goal: Will not experience complications related to urinary retention Outcome: Progressing   Problem: Pain Managment: Goal: General experience of comfort will improve and/or be controlled Outcome: Progressing   Problem: Safety: Goal: Ability to remain free from injury will improve Outcome: Progressing   Problem: Skin Integrity: Goal: Risk for impaired  skin integrity will decrease Outcome: Progressing   Problem: Education: Goal: Knowledge of Childbirth will improve Outcome: Progressing Goal: Ability to make informed decisions regarding treatment and plan of care will improve Outcome: Progressing Goal: Ability to state and carry out methods to decrease the pain will improve Outcome: Progressing Goal: Individualized Educational Video(s) Outcome: Progressing   Problem: Coping: Goal: Ability to verbalize concerns and feelings about labor and delivery will improve Outcome: Progressing   Problem: Life Cycle: Goal: Ability to make normal progression through stages of labor will improve Outcome: Progressing Goal: Ability to effectively push during vaginal delivery will improve Outcome: Progressing   Problem: Role Relationship: Goal: Will demonstrate positive interactions with the child Outcome: Progressing   Problem: Safety: Goal: Risk of complications during labor and delivery will decrease Outcome: Progressing   Problem: Pain Management: Goal: Relief or control of pain from uterine contractions will improve Outcome: Progressing

## 2024-04-30 NOTE — Progress Notes (Signed)
 LABOR PROGRESS NOTE Discussed check and AROM with patient at 2030, she is agreeable.  Patient comfortable, able to talk through ctx. Pit at 4. Nitrous at bedside.   In for AROM at 2110.  Cervix unchanged, 3-4/50/-3 Cervix posterior, no AROM  FHT: baseline 120, mod variability, +accels, -decels; overall category I. Toco: q2-4 min  A/P:  Titrate pit per protocol Ambulate, birthing ball, etc Prefer non-epidural pain control   Barabara Maier, DO 8:32 PM

## 2024-04-30 NOTE — MAU Note (Signed)
 Patient is G1P0, 100w4d that presents with complaint high BP readings at home. Patient states that her BP at home was 157/103. Patient states that she was advised by the office to check her BP every other day. Patient states that she has +FM. No ctx. No LOF or bleeding. Patient denies headache. No visual changes/disturbances. No RUQ pain. Patient does have bilateral lower extremity swelling and some facial swelling. VSS. Monitors applied and assessing.

## 2024-04-30 NOTE — H&P (Signed)
 OBSTETRIC ADMISSION HISTORY AND PHYSICAL  SHARDEE DIEU is 23 y.o. G1P0000 with IUP at [redacted]w[redacted]d 05/17/2024, by Last Menstrual Period presenting for gHTN at term. She received her prenatal care at [facility]   ROS (+) FM, facial swelling (-) ctx, VB, LOF. HA, visual changes, CP, SOB, RUQ pain,   Prenatal History/Complications NURSING  PROVIDER  Office Location [facility] Dating by  LMP c/w US  at [redacted]w[redacted]d  [facility] Model Traditional Anatomy U/S    Initiated care at  9wks                 Language  English               LAB RESULTS   Support Person  Margert (so) International aid/development worker (mom) Owens (SO's Mom) Genetics NIPS: Low risk AFP:       NT/IT (FT only)        Carrier Screen Horizon: Neg x 4  Rhogam  B/Positive/-- (02/25 0945) A1C/GTT Early HgbA1C: 4.9 Third trimester 2 hr GTT:   Flu Vaccine  No      TDaP Vaccine   Blood Type B/Positive/-- (02/25 0945)  RSV Vaccine   Antibody Negative (02/25 0945)  COVID Vaccine  Yes Rubella 0.96 (02/25 0945)  Feeding Plan breast RPR Non Reactive (02/25 0945)  Contraception undecided HBsAg Negative (02/25 0945)  Circumcision  Yes if female HIV Non Reactive (02/25 0945)  Pediatrician   [facility] HCVAb Non Reactive (02/25 0945)  Prenatal Classes        BTL Consent   Pap       Diagnosis  Date Value Ref Range Status  01/18/2023     Final    - Negative for intraepithelial lesion or malignancy (NILM)    BTL Pre-payment   GC/CT Initial:   36wks:    VBAC Consent   GBS For PCN allergy, check sensitivities   BRx Optimized? [ ]  yes   [ ]  no      DME Rx [X]  BP cuff [ ]  Weight Scale Waterbirth  [ ]  Class [ ]  Consent [ ]  CNM visit  PHQ9 & GAD7 [X]  new OB [  ] 28 weeks  [  ] 36 weeks Induction  [ ]  Orders Entered [ ] Foley Y/N   Patient Active Problem List   Diagnosis Date Noted   Echogenic focus of heart of fetus affecting antepartum care of mother, single gestation 12/22/2023   Fetal renal anomaly, single gestation (mild UTD) 12/22/2023   Obesity affecting pregnancy,  antepartum 12/16/2023   Rubella non-immune status, antepartum 11/10/2023   Supervision of high risk pregnancy, antepartum, second trimester 10/13/2023   Dermoid cyst of left ovary 06/12/2023   Genital herpes 11/18/2018   GERD (gastroesophageal reflux disease) 08/18/2015   Generalized anxiety disorder 01/29/2015   Major depression, recurrent (HCC) 01/29/2015   Obesity    Asthma     Past Medical History: Past Medical History:  Diagnosis Date   Acanthosis nigricans, acquired    Acne 06/14/2015   Anxiety    Asthma    prn inhaler   Bipolar and related disorder (HCC) 12/17/2015   BRBPR (bright red blood per rectum) 07/19/2019   Chronic constipation 03/24/2012   Cyst of ovary, left    Decreased visual acuity 02/27/2015   Depression    Dyspareunia, female 07/10/2022   Dyspepsia    no current med.   Dysphagia 04/21/2012   Dyspnea on exertion 01/18/2023   Patient recently started exercising and reports getting SOB with exertion.  She thinks this is likely related to her weight/deconditioning. Hx of asthma but she does not think albuterol  helps her symptoms.     Eczema    Goiter 12/14/2011   Herpes 2018   Insomnia 03/04/2016   Obesity    Oppositional defiant disorder    Post-operative nausea and vomiting    PTSD (post-traumatic stress disorder) 02/02/2015   Seasonal allergies    Soy allergy 04/29/2012   Vaginismus 06/05/2022    Past Surgical History: Past Surgical History:  Procedure Laterality Date   CLOSED REDUCTION AND PERCUTANEOUS PINNING OF HUMERUS FRACTURE Right 10/31/2005   supracondylar humerus fx.   CYST EXCISION Right 07/11/2002   temple area   MINOR SUPPRELIN  REMOVAL Left 01/11/2014   Procedure: REMOVAL OF SUPPRELIN  IMPLANT IN LEFT UPPER EXTREMITY;  Surgeon: CHRISTELLA. Julietta Millman, MD;  Location: [facility];  Service: Pediatrics;  Laterality: Left;   MOUTH SURGERY     SUPPRELIN  IMPLANT  01/14/2012   Procedure: SUPPRELIN  IMPLANT;  Surgeon: CHRISTELLA. Julietta Millman, MD;  Location: [facility];  Service: Pediatrics;  Laterality: Left;   TOENAIL EXCISION Right 03/19/2008   great toe    Social History Social History   Socioeconomic History   Marital status: Single    Spouse name: Not on file   Number of children: 0   Years of education: Not on file   Highest education level: Not on file  Occupational History   Occupation: minor    Employer: [employer]    Comment: 4th grade at [employer]  Tobacco Use   Smoking status: Never   Smokeless tobacco: Never  Vaping Use   Vaping status: Never Used  Substance and Sexual Activity   Alcohol use: Not Currently    Comment: rarely will do wine   Drug use: Not Currently    Types: Marijuana   Sexual activity: Yes    Birth control/protection: None  Other Topics Concern   Not on file  Social History Narrative   Pt lived at home with mother.   Social Drivers of Corporate investment banker Strain: Not on file  Food Insecurity: No Food Insecurity (04/02/2024)   Hunger Vital Sign    Worried About Running Out of Food in the Last Year: Never true    Ran Out of Food in the Last Year: Never true  Transportation Needs: No Transportation Needs (04/02/2024)   PRAPARE - Administrator, Civil Service (Medical): No    Lack of Transportation (Non-Medical): No  Physical Activity: Not on file  Stress: Not on file  Social Connections: Unknown (01/27/2022)   Received from [facility]   Social Network    Social Network: Not on file    Family History: Family History  Problem Relation Age of Onset   Stroke Mother    Asthma Mother    Depression Mother    Hypertension Father    Heart disease Father    Asthma Father    Eczema Father    Cancer Maternal Aunt    Cancer Paternal Grandfather     Allergies: Allergies  Allergen Reactions   Other Shortness Of Breath and Other (See Comments)    Any type of BEANS exacerbate the patient's asthma   Trichophyton Shortness Of  Breath and Other (See Comments)    Wheezing  Wheezing   Midazolam  Hcl Nausea And Vomiting   Penicillins Hives and Rash    Has patient had a PCN reaction causing immediate rash, facial/tongue/throat swelling,  SOB or lightheadedness with hypotension: Yes Has patient had a PCN reaction causing severe rash involving mucus membranes or skin necrosis: No Has patient had a PCN reaction that required hospitalization: No Has patient had a PCN reaction occurring within the last 10 years: No If all of the above answers are NO, then may proceed with Cephalosporin use. Has patient had a PCN reaction causing immediate rash, facial/tongue/throat swelling, SOB or lightheadedness with hypotension: Yes Has patient had a PCN reaction causing severe rash involving mucus membranes or skin necrosis: No Has patient had a PCN reaction that required hospitalization: No Has patient had a PCN reaction occurring within the last 10 years: No If all of the above answers are NO, then may proceed with Cephalosporin use.   Soy Allergy (Obsolete) Other (See Comments)    WHEEZING/EXACERBATES ASTHMA WHEEZING/EXACERBATES ASTHMA   Versed  [Midazolam  Hcl] Nausea And Vomiting   Ranitidine  Hcl Rash   Zantac  [Ranitidine  Hcl] Rash    Medications Prior to Admission  Medication Sig Dispense Refill Last Dose/Taking   albuterol  (VENTOLIN  HFA) 108 (90 Base) MCG/ACT inhaler Inhale 2 puffs into the lungs every 6 (six) hours as needed for wheezing or shortness of breath. 8 g 2 04/29/2024   Blood Pressure Monitoring (BLOOD PRESSURE KIT) DEVI 1 Device by Does not apply route once a week. 1 each 0 04/30/2024 Morning   Ferric Maltol  (ACCRUFER ) 30 MG CAPS Take 1 capsule (30 mg total) by mouth daily. 30 capsule 3 04/29/2024   loratadine  (CLARITIN ) 10 MG tablet Take 1 tablet (10 mg total) by mouth daily. 30 tablet 11 04/29/2024   omeprazole  (PRILOSEC ) 20 MG capsule TAKE 1 CAPSULE(20 MG) BY MOUTH DAILY 30 capsule 3 04/29/2024   Prenatal Vit-Fe  Fumarate-FA (PRENATAL VITAMIN PO) Take 1 tablet by mouth daily.   04/29/2024   sertraline (ZOLOFT) 25 MG tablet Take 25 mg by mouth daily.   04/29/2024   valACYclovir  (VALTREX ) 500 MG tablet Take 1 tablet (500 mg total) by mouth daily. 30 tablet 1 Past Week   ARIPiprazole  (ABILIFY ) 20 MG tablet Take 20 mg by mouth daily.      Cholecalciferol (VITAMIN D3) 50 MCG (2000 UT) capsule Take 1 capsule (2,000 Units total) by mouth daily. 120 capsule 0 More than a month   EPINEPHrine  (EPIPEN  2-PAK) 0.3 mg/0.3 mL IJ SOAJ injection Inject 0.3 mLs (0.3 mg total) into the muscle as needed for anaphylaxis (if you use this, you must call 911 immediately). 1 each 1    hydrocortisone  cream 1 % Apply 1 application topically 2 (two) times daily as needed (eczema). 30 g 0 More than a month   omeprazole  (PRILOSEC  OTC) 20 MG tablet Take 1 tablet (20 mg total) by mouth daily. (Patient not taking: Reported on 04/27/2024) 30 tablet 2    polyethylene glycol powder (GLYCOLAX /MIRALAX ) 17 GM/SCOOP powder Take 17 g by mouth as needed for moderate constipation, mild constipation or severe constipation.   More than a month   Prenat-Fe Poly-Methfol-FA-DHA (VITAFOL  ULTRA) 29-0.6-0.4-200 MG CAPS Take 1 capsule by mouth daily before breakfast. (Patient not taking: Reported on 04/27/2024) 90 capsule 4      Review of Systems  All systems reviewed and negative except as stated in HPI  PHYSICAL EXAM Blood pressure (!) 158/98, pulse 79, last menstrual period 08/11/2023, SpO2 100%. General appearance: alert and cooperative Lungs: respirations nonlabored Heart: regular rate Abdomen: gravid  Fetal monitoringBaseline: 130 bpm, Variability: Good {> 6 bpm), Accelerations: Reactive, and Decelerations: Absent Uterine activityNone  Presentation: cephalic   Prenatal labs: ABO, Rh: B/Positive/-- (02/25 0945) Antibody: Negative (02/25 0945) Rubella: 0.96 (02/25 0945) RPR: Non Reactive (05/30 0832)  HBsAg: Negative (02/25 0945)  HIV:  Non Reactive (05/30 0832)  GBS:     No results found for: GBS  culture pending Maternal Diabetes: No Genetic Screening: Normal Anatomy US : mild UTD and a left sided EIF (normal variant)   Immunization History  Administered Date(s) Administered   HPV Quadrivalent 04/11/2013, 06/12/2013, 04/17/2014   Hepatitis A 10/19/2007   Meningococcal Conjugate 04/11/2013   Meningococcal Mcv4o 11/18/2018   PFIZER Comirnaty(Gray Top)Covid-19 Tri-Sucrose Vaccine 03/06/2021   PFIZER(Purple Top)SARS-COV-2 Vaccination 05/21/2020, 06/27/2020   Pfizer Covid-19 Vaccine Bivalent Booster 58yrs & up 07/24/2021   Tdap 04/11/2013, 06/21/2023    Prenatal Transfer Tool  Maternal Diabetes: No Genetic Screening: Normal Maternal Ultrasounds/Referrals: Isolated EIF (echogenic intracardiac focus) and Fetal renal pyelectasis Fetal Ultrasounds or other Referrals:  None Maternal Substance Abuse:  No Significant Maternal Medications:  Meds include: Protonix  Other:  Abilify  Significant Maternal Lab Results: None Number of Prenatal Visits:greater than 3 verified prenatal visits Maternal Vaccinations:TDap last 06/2023 Other Comments:  None   Results for orders placed or performed during the hospital encounter of 04/30/24 (from the past 24 hours)  Protein / creatinine ratio, urine   Collection Time: 04/30/24 12:44 AM  Result Value Ref Range   Creatinine, Urine 44 mg/dL   Total Protein, Urine <6 mg/dL   Protein Creatinine Ratio        0.00 - 0.15 mg/mg[Cre]  CBC   Collection Time: 04/30/24  1:09 AM  Result Value Ref Range   WBC 7.6 4.0 - 10.5 K/uL   RBC 3.23 (L) 3.87 - 5.11 MIL/uL   Hemoglobin 10.0 (L) 12.0 - 15.0 g/dL   HCT 68.8 (L) 63.9 - 53.9 %   MCV 96.3 80.0 - 100.0 fL   MCH 31.0 26.0 - 34.0 pg   MCHC 32.2 30.0 - 36.0 g/dL   RDW 84.3 (H) 88.4 - 84.4 %   Platelets 291 150 - 400 K/uL   nRBC 0.3 (H) 0.0 - 0.2 %  Comprehensive metabolic panel   Collection Time: 04/30/24  1:09 AM  Result Value Ref Range    Sodium 140 135 - 145 mmol/L   Potassium 3.9 3.5 - 5.1 mmol/L   Chloride 107 98 - 111 mmol/L   CO2 21 (L) 22 - 32 mmol/L   Glucose, Bld 84 70 - 99 mg/dL   BUN 5 (L) 6 - 20 mg/dL   Creatinine, Ser 9.15 0.44 - 1.00 mg/dL   Calcium  8.8 (L) 8.9 - 10.3 mg/dL   Total Protein 6.4 (L) 6.5 - 8.1 g/dL   Albumin 2.7 (L) 3.5 - 5.0 g/dL   AST 20 15 - 41 U/L   ALT 18 0 - 44 U/L   Alkaline Phosphatase 161 (H) 38 - 126 U/L   Total Bilirubin 0.4 0.0 - 1.2 mg/dL   GFR, Estimated >39 >39 mL/min   Anion gap 12 5 - 15    Patient Active Problem List   Diagnosis Date Noted   Echogenic focus of heart of fetus affecting antepartum care of mother, single gestation 12/22/2023   Fetal renal anomaly, single gestation (mild UTD) 12/22/2023   Obesity affecting pregnancy, antepartum 12/16/2023   Rubella non-immune status, antepartum 11/10/2023   Supervision of high risk pregnancy, antepartum, second trimester 10/13/2023   Dermoid cyst of left ovary 06/12/2023   Genital herpes 11/18/2018   GERD (gastroesophageal reflux disease)  08/18/2015   Generalized anxiety disorder 01/29/2015   Major depression, recurrent (HCC) 01/29/2015   Obesity    Asthma     ASSESSMENT & PLAN CALYSE MURCIA is 23 y.o. G1P0000 with IUP at [redacted]w[redacted]d, 05/17/2024, by Last Menstrual Period admitted for IOL gHTN at term.   Sono @[redacted]w[redacted]d , CWD, normal anatomy, cephalic presentation, anterior placenta, EFW 2093g, (67%)  #Labor: IOL, Dctyo place 0415 #Pain: Positions, shower, N2O #FWB: Cat 1  #gHTN 130s/80s since 35w appt - preE labs negative at admit: Cr 0.84, urine P/C too low to calculate, plt# WNL. AST, ALT WNL, ALP elevated.  - severe range bp protocol PRN  #GBS unknown - VR culture from 8/14 pending; PCR presumptive negative - empiric tx not indicated  #HSV - no outbreaks in preg - Valtrex  suppression - SSE no lesion  #bipolar, anxiety, PTSD Established with a psychiatrist. Previously on Abilify  20mg  daily, transitioned to  SSRI d/t concern of breastmilk production. - continue Zoloft 25mg  daily  #asthma, mild intermittent - albuterol  about 1x/wk - avoid methergine if PPH  #anemia Admit Hgb 10.0  #obesity - not on ASA81  #RNI - PP MMR  #dermoid cyst - L adnexa, asx  #GBS status:  Pending; 8/14 culture, PCR collected at admit #Feeding: Breastmilk  #Reproductive Life planning: Condoms #Circ:  yes    Barabara Maier, DO FMOB Fellow, Faculty practice [facility], [facility] [employer]

## 2024-04-30 NOTE — MAU Provider Note (Signed)
 Chief Complaint:  Hypertension   HPI   Deanna Mckay is a 23 y.o. G1P0000 at [redacted]w[redacted]d who presents to maternity admissions reporting elevated blood pressures at home.  Patient states the highest blood pressure reading she got was 157/103 at 11:38 PM yesterday.  Patient denies any headaches, visual changes, right upper quadrant pain, nausea vomiting, chest pain.  She does report some mild shortness of breath on exertion  She denies any vaginal bleeding, leaking of fluid, contractions and reports good fetal movements   Pregnancy Course: Femina  Past Medical History:  Diagnosis Date   Acanthosis nigricans, acquired    Acne 06/14/2015   Anxiety    Asthma    prn inhaler   Bipolar and related disorder (HCC) 12/17/2015   BRBPR (bright red blood per rectum) 07/19/2019   Chronic constipation 03/24/2012   Cyst of ovary, left    Decreased visual acuity 02/27/2015   Depression    Dyspareunia, female 07/10/2022   Dyspepsia    no current med.   Dysphagia 04/21/2012   Dyspnea on exertion 01/18/2023   Patient recently started exercising and reports getting SOB with exertion. She thinks this is likely related to her weight/deconditioning. Hx of asthma but she does not think albuterol  helps her symptoms.     Eczema    Goiter 12/14/2011   Herpes 2018   Insomnia 03/04/2016   Obesity    Oppositional defiant disorder    Post-operative nausea and vomiting    PTSD (post-traumatic stress disorder) 02/02/2015   Seasonal allergies    Soy allergy 04/29/2012   Vaginismus 06/05/2022   OB History  Gravida Para Term Preterm AB Living  1 0 0 0 0 0  SAB IAB Ectopic Multiple Live Births  0 0 0 0 0    # Outcome Date GA Lbr Len/2nd Weight Sex Type Anes PTL Lv  1 Current            Past Surgical History:  Procedure Laterality Date   CLOSED REDUCTION AND PERCUTANEOUS PINNING OF HUMERUS FRACTURE Right 10/31/2005   supracondylar humerus fx.   CYST EXCISION Right 07/11/2002   temple area   MINOR  SUPPRELIN  REMOVAL Left 01/11/2014   Procedure: REMOVAL OF SUPPRELIN  IMPLANT IN LEFT UPPER EXTREMITY;  Surgeon: CHRISTELLA. Julietta Millman, MD;  Location: [facility];  Service: Pediatrics;  Laterality: Left;   MOUTH SURGERY     SUPPRELIN  IMPLANT  01/14/2012   Procedure: SUPPRELIN  IMPLANT;  Surgeon: CHRISTELLA. Julietta Millman, MD;  Location:  SURGERY CENTER;  Service: Pediatrics;  Laterality: Left;   TOENAIL EXCISION Right 03/19/2008   great toe   Family History  Problem Relation Age of Onset   Stroke Mother    Asthma Mother    Depression Mother    Hypertension Father    Heart disease Father    Asthma Father    Eczema Father    Cancer Maternal Aunt    Cancer Paternal Grandfather    Social History   Tobacco Use   Smoking status: Never   Smokeless tobacco: Never  Vaping Use   Vaping status: Never Used  Substance Use Topics   Alcohol use: Not Currently    Comment: rarely will do wine   Drug use: Not Currently    Types: Marijuana   Allergies  Allergen Reactions   Other Shortness Of Breath and Other (See Comments)    Any type of BEANS exacerbate the patient's asthma   Trichophyton Shortness Of Breath and Other (See Comments)  Wheezing  Wheezing   Midazolam  Hcl Nausea And Vomiting   Penicillins Hives and Rash    Has patient had a PCN reaction causing immediate rash, facial/tongue/throat swelling, SOB or lightheadedness with hypotension: Yes Has patient had a PCN reaction causing severe rash involving mucus membranes or skin necrosis: No Has patient had a PCN reaction that required hospitalization: No Has patient had a PCN reaction occurring within the last 10 years: No If all of the above answers are NO, then may proceed with Cephalosporin use. Has patient had a PCN reaction causing immediate rash, facial/tongue/throat swelling, SOB or lightheadedness with hypotension: Yes Has patient had a PCN reaction causing severe rash involving mucus membranes or skin necrosis:  No Has patient had a PCN reaction that required hospitalization: No Has patient had a PCN reaction occurring within the last 10 years: No If all of the above answers are NO, then may proceed with Cephalosporin use.   Soy Allergy (Obsolete) Other (See Comments)    WHEEZING/EXACERBATES ASTHMA WHEEZING/EXACERBATES ASTHMA   Versed  [Midazolam  Hcl] Nausea And Vomiting   Ranitidine  Hcl Rash   Zantac  [Ranitidine  Hcl] Rash   Medications Prior to Admission  Medication Sig Dispense Refill Last Dose/Taking   albuterol  (VENTOLIN  HFA) 108 (90 Base) MCG/ACT inhaler Inhale 2 puffs into the lungs every 6 (six) hours as needed for wheezing or shortness of breath. 8 g 2 04/29/2024   Blood Pressure Monitoring (BLOOD PRESSURE KIT) DEVI 1 Device by Does not apply route once a week. 1 each 0 04/30/2024 Morning   Ferric Maltol  (ACCRUFER ) 30 MG CAPS Take 1 capsule (30 mg total) by mouth daily. 30 capsule 3 04/29/2024   loratadine  (CLARITIN ) 10 MG tablet Take 1 tablet (10 mg total) by mouth daily. 30 tablet 11 04/29/2024   omeprazole  (PRILOSEC ) 20 MG capsule TAKE 1 CAPSULE(20 MG) BY MOUTH DAILY 30 capsule 3 04/29/2024   Prenatal Vit-Fe Fumarate-FA (PRENATAL VITAMIN PO) Take 1 tablet by mouth daily.   04/29/2024   sertraline (ZOLOFT) 25 MG tablet Take 25 mg by mouth daily.   04/29/2024   valACYclovir  (VALTREX ) 500 MG tablet Take 1 tablet (500 mg total) by mouth daily. 30 tablet 1 Past Week   ARIPiprazole  (ABILIFY ) 20 MG tablet Take 20 mg by mouth daily.      Cholecalciferol (VITAMIN D3) 50 MCG (2000 UT) capsule Take 1 capsule (2,000 Units total) by mouth daily. 120 capsule 0 More than a month   EPINEPHrine  (EPIPEN  2-PAK) 0.3 mg/0.3 mL IJ SOAJ injection Inject 0.3 mLs (0.3 mg total) into the muscle as needed for anaphylaxis (if you use this, you must call 911 immediately). 1 each 1    hydrocortisone  cream 1 % Apply 1 application topically 2 (two) times daily as needed (eczema). 30 g 0 More than a month   omeprazole   (PRILOSEC  OTC) 20 MG tablet Take 1 tablet (20 mg total) by mouth daily. (Patient not taking: Reported on 04/27/2024) 30 tablet 2    polyethylene glycol powder (GLYCOLAX /MIRALAX ) 17 GM/SCOOP powder Take 17 g by mouth as needed for moderate constipation, mild constipation or severe constipation.   More than a month   Prenat-Fe Poly-Methfol-FA-DHA (VITAFOL  ULTRA) 29-0.6-0.4-200 MG CAPS Take 1 capsule by mouth daily before breakfast. (Patient not taking: Reported on 04/27/2024) 90 capsule 4     I have reviewed patient's Past Medical Hx, Surgical Hx, Family Hx, Social Hx, medications and allergies.   ROS  Pertinent items noted in HPI and remainder of comprehensive ROS otherwise negative.  PHYSICAL EXAM  Patient Vitals for the past 24 hrs:  BP Pulse SpO2  04/30/24 0145 (!) 162/111 83 100 %  04/30/24 0140 -- -- 100 %  04/30/24 0135 -- -- 100 %  04/30/24 0130 (!) 156/102 83 100 %  04/30/24 0125 -- -- 100 %  04/30/24 0120 -- -- 100 %  04/30/24 0115 (!) 172/110 83 100 %  04/30/24 0110 -- -- 100 %  04/30/24 0105 -- -- 100 %  04/30/24 0100 (!) 157/97 86 100 %  04/30/24 0055 -- -- 100 %  04/30/24 0048 (!) 177/103 87 --    Constitutional: Well-developed, obese female with visual facial edema,in NAD  Cardiovascular: Hypertensive ,normal heart rate & rhythm, warm and well-perfused Respiratory: normal effort, no problems with respiration noted, Lungs BCTA GI: Abd soft, non-tender, gravid, no ruq pain illicited  MS: Extremities B/L tender with 3+ lower extremity edema, negative Homans' sign,  Neurologic: Alert and oriented x 4.  GU: no CVA tenderness Pelvic: Deferred       Fetal Tracing: Cat 1 reactive Baseline: 125-130 Variability: moderate  Accelerations: present Decelerations:absent Toco: UI   Labs: Results for orders placed or performed during the hospital encounter of 04/30/24 (from the past 24 hours)  Protein / creatinine ratio, urine     Status: None   Collection Time: 04/30/24  12:44 AM  Result Value Ref Range   Creatinine, Urine 44 mg/dL   Total Protein, Urine <6 mg/dL   Protein Creatinine Ratio        0.00 - 0.15 mg/mg[Cre]  CBC     Status: Abnormal   Collection Time: 04/30/24  1:09 AM  Result Value Ref Range   WBC 7.6 4.0 - 10.5 K/uL   RBC 3.23 (L) 3.87 - 5.11 MIL/uL   Hemoglobin 10.0 (L) 12.0 - 15.0 g/dL   HCT 68.8 (L) 63.9 - 53.9 %   MCV 96.3 80.0 - 100.0 fL   MCH 31.0 26.0 - 34.0 pg   MCHC 32.2 30.0 - 36.0 g/dL   RDW 84.3 (H) 88.4 - 84.4 %   Platelets 291 150 - 400 K/uL   nRBC 0.3 (H) 0.0 - 0.2 %  Comprehensive metabolic panel     Status: Abnormal   Collection Time: 04/30/24  1:09 AM  Result Value Ref Range   Sodium 140 135 - 145 mmol/L   Potassium 3.9 3.5 - 5.1 mmol/L   Chloride 107 98 - 111 mmol/L   CO2 21 (L) 22 - 32 mmol/L   Glucose, Bld 84 70 - 99 mg/dL   BUN 5 (L) 6 - 20 mg/dL   Creatinine, Ser 9.15 0.44 - 1.00 mg/dL   Calcium  8.8 (L) 8.9 - 10.3 mg/dL   Total Protein 6.4 (L) 6.5 - 8.1 g/dL   Albumin 2.7 (L) 3.5 - 5.0 g/dL   AST 20 15 - 41 U/L   ALT 18 0 - 44 U/L   Alkaline Phosphatase 161 (H) 38 - 126 U/L   Total Bilirubin 0.4 0.0 - 1.2 mg/dL   GFR, Estimated >39 >39 mL/min   Anion gap 12 5 - 15     MDM & MAU COURSE  MDM:  HIGH- > Admit for Preeclampsia and IOL  Preeclampsia with SF by SRBP's  PreE Labs pending GBS by PCR ordered D/W Dr Abigail [facility] MAU Attending)  MAU Course: Orders Placed This Encounter  Procedures   Group B strep by PCR   CBC   Comprehensive metabolic panel   Protein /  creatinine ratio, urine   Notify physician (specify) Confirmatory reading of BP> 160/110 15 minutes later   Apply Hypertensive Disorders of Pregnancy Care Plan   Vital signs   Measure blood pressure   Saline lock IV   Meds ordered this encounter  Medications   AND Linked Order Group    NIFEdipine  (PROCARDIA ) capsule 10 mg    NIFEdipine  (PROCARDIA ) capsule 20 mg    NIFEdipine  (PROCARDIA ) capsule 20 mg    labetalol  (NORMODYNE )  injection 40 mg    Pt informed that the ultrasound is considered a limited OB ultrasound and is not intended to be a complete ultrasound exam.  Patient also informed that the ultrasound is not being completed with the intent of assessing for fetal or placental anomalies or any pelvic abnormalities.  Explained that the purpose of today's ultrasound is to assess for  presentation.  Patient acknowledges the purpose of the exam and the limitations of the study.   Vertex  presentation confirmed  ASSESSMENT   1. Preeclampsia, severe, third trimester   2. [redacted] weeks gestation of pregnancy   3. Obesity affecting pregnancy, antepartum, third trimester     PLAN   Admit to LD for IOL secondary to Preeclampsia with SF  Admission orders per LD Team GBS by PCR obtained  D/W Dr Abigail (OB Attending) --------------------------------------------------------------------------------  Olam Dalton, MSN, [facility], Center for [facility]

## 2024-04-30 NOTE — Progress Notes (Signed)
 Patient ID: Deanna Mckay, female   DOB: 25-Apr-2001, 23 y.o.   MRN: [id number]  S/p cervical foley and cytotec  x 2; Pitocin  started at 1600  BPs 135/85, 144/83 FHR 130s, +accels, no decels, Cat 1 Ctx q 2-3 mins Cx 3/50/vtx -2 per RN at start of Pit  IUP@37 .4wks gHTN IOL process  Continue to uptitrate Pit to achieve active labor Anticipate vag delivery  Deanna Mckay CNM 04/30/2024 4:53 PM

## 2024-04-30 NOTE — Progress Notes (Signed)
 Patient ID: SAIDEE GEREMIA, female   DOB: 13-Mar-2001, 23 y.o.   MRN: [id number]  Was resting; feeling well s/p dual dose cytotec - not really cramping; denies s/s pre-e  BPs 150/102, 139/94, 147/99 FHR 135-145, +accels, no decels, Cat 1 Ctx irreg, mild Cx 1+/60/vtx -2  IUP@37 .4wks gHTN Cx unfavorable  Cervical foley inserted without difficulty and inflated with 60cc fluid; buccal cytotec  50mcg also given; plan for Pitocin  when foley dislodges  Suzen JONETTA Gentry CNM 04/30/2024 11:30 AM

## 2024-05-01 ENCOUNTER — Inpatient Hospital Stay (HOSPITAL_COMMUNITY): Payer: MEDICAID | Admitting: Anesthesiology

## 2024-05-01 LAB — STREP GP B CULTURE+RFLX: Strep Gp B Culture+Rflx: NEGATIVE

## 2024-05-01 LAB — CBC
HCT: 28.7 % — ABNORMAL LOW (ref 36.0–46.0)
Hemoglobin: 9.1 g/dL — ABNORMAL LOW (ref 12.0–15.0)
MCH: 30.3 pg (ref 26.0–34.0)
MCHC: 31.7 g/dL (ref 30.0–36.0)
MCV: 95.7 fL (ref 80.0–100.0)
Platelets: 267 K/uL (ref 150–400)
RBC: 3 MIL/uL — ABNORMAL LOW (ref 3.87–5.11)
RDW: 15.8 % — ABNORMAL HIGH (ref 11.5–15.5)
WBC: 9.6 K/uL (ref 4.0–10.5)
nRBC: 0 % (ref 0.0–0.2)

## 2024-05-01 LAB — CERVICOVAGINAL ANCILLARY ONLY
Chlamydia: NEGATIVE
Comment: NEGATIVE
Comment: NORMAL
Neisseria Gonorrhea: NEGATIVE

## 2024-05-01 MED ORDER — LIDOCAINE HCL (PF) 1 % IJ SOLN
INTRAMUSCULAR | Status: DC | PRN
  Administered 2024-05-01 (×2): 4 mL via EPIDURAL

## 2024-05-01 MED ORDER — PHENYLEPHRINE 80 MCG/ML (10ML) SYRINGE FOR IV PUSH (FOR BLOOD PRESSURE SUPPORT): 80.0000 ug | PREFILLED_SYRINGE | INTRAVENOUS | Status: DC | PRN

## 2024-05-01 MED ORDER — LACTATED RINGERS IV SOLN: 500.0000 mL | Freq: Once | INTRAVENOUS | Status: DC

## 2024-05-01 MED ORDER — EPHEDRINE 5 MG/ML INJ: 10.0000 mg | INTRAVENOUS | Status: DC | PRN

## 2024-05-01 MED ORDER — DIPHENHYDRAMINE HCL 50 MG/ML IJ SOLN: 12.5000 mg | INTRAMUSCULAR | Status: DC | PRN

## 2024-05-01 MED ORDER — FENTANYL-BUPIVACAINE-NACL 0.5-0.125-0.9 MG/250ML-% EP SOLN
12.0000 mL/h | EPIDURAL | Status: DC | PRN
  Administered 2024-05-01 (×2): 12 mL/h via EPIDURAL
  Filled 2024-05-01 (×3): qty 250

## 2024-05-01 NOTE — Anesthesia Procedure Notes (Signed)
 Epidural Patient location during procedure: OB Start time: 05/01/2024 4:41 AM End time: 05/01/2024 4:46 AM  Staffing Anesthesiologist: Peggye Delon Brunswick, MD Performed: anesthesiologist   Preanesthetic Checklist Completed: patient identified, IV checked, site marked, risks and benefits discussed, surgical consent, monitors and equipment checked, pre-op evaluation and timeout performed  Epidural Patient position: sitting Prep: DuraPrep and site prepped and draped Patient monitoring: continuous pulse ox and blood pressure Approach: midline Location: L3-L4 Injection technique: LOR saline  Needle:  Needle type: Tuohy  Needle gauge: 17 G Needle length: 9 cm and 9 Needle insertion depth: 7.5 cm Catheter type: closed end flexible Catheter size: 19 Gauge Catheter at skin depth: 12 cm Test dose: negative  Assessment Events: blood not aspirated, no cerebrospinal fluid, injection not painful, no injection resistance, no paresthesia and negative IV test  Additional Notes The patient has requested an epidural for labor pain management. Risks and benefits including, but not limited to, infection, bleeding, local anesthetic toxicity, headache, hypotension, back pain, block failure, etc. were discussed with the patient. The patient expressed understanding and consented to the procedure. I confirmed that the patient has no bleeding disorders and is not taking blood thinners. I confirmed the patient's last platelet count with the nurse. A time-out was performed immediately prior to the procedure. Please see nursing documentation for vital signs. Sterile technique was used throughout the whole procedure. Once LOR achieved, the epidural catheter threaded easily without resistance. Aspiration of the catheter was negative for blood and CSF. The epidural was dosed slowly and an infusion was started.  1 attempt(s)Reason for block:procedure for pain

## 2024-05-01 NOTE — Progress Notes (Signed)
 Patient Vitals for the past 4 hrs:  BP Temp Temp src Pulse Resp  05/01/24 2000 (!) 147/95 -- -- (!) 103 --  05/01/24 1930 (!) 159/100 98.5 F (36.9 C) Oral (!) 106 16  05/01/24 1900 (!) 150/103 -- -- 98 --  05/01/24 1830 131/73 -- -- 99 --  05/01/24 1800 (!) 141/78 -- -- 99 --  05/01/24 1750 -- 98.5 F (36.9 C) Oral -- 18  05/01/24 1730 (!) 142/86 -- -- 85 --  05/01/24 1700 (!) 147/100 -- -- 89 --   Feeling some pressure.  Cx small rim on Left side/-1 station. FHR Cat 1.  Turned to left side

## 2024-05-01 NOTE — Anesthesia Preprocedure Evaluation (Signed)
 Anesthesia Evaluation  Patient identified by MRN, date of birth, ID band Patient awake    Reviewed: Allergy & Precautions, NPO status , Patient's Chart, lab work & pertinent test results  History of Anesthesia Complications (+) PONV and history of anesthetic complications  Airway Mallampati: III  TM Distance: >3 FB Neck ROM: Full    Dental   Pulmonary asthma    Pulmonary exam normal breath sounds clear to auscultation       Cardiovascular hypertension (gestational),  Rhythm:Regular Rate:Normal     Neuro/Psych  PSYCHIATRIC DISORDERS (PTSD, ODD) Anxiety Depression       GI/Hepatic Neg liver ROS,GERD  Medicated,,  Endo/Other  neg diabetes  Class 3 obesityGoiter   Renal/GU negative Renal ROS     Musculoskeletal   Abdominal  (+) + obese  Peds  Hematology  (+) Blood dyscrasia, anemia Lab Results      Component                Value               Date                      WBC                      9.6                 05/01/2024                HGB                      9.1 (L)             05/01/2024                HCT                      28.7 (L)            05/01/2024                MCV                      95.7                05/01/2024                PLT                      267                 05/01/2024              Anesthesia Other Findings   Reproductive/Obstetrics (+) Pregnancy                              Anesthesia Physical Anesthesia Plan  ASA: 3  Anesthesia Plan: Epidural   Post-op Pain Management:    Induction:   PONV Risk Score and Plan:   Airway Management Planned: Natural Airway  Additional Equipment:   Intra-op Plan:   Post-operative Plan:   Informed Consent: I have reviewed the patients History and Physical, chart, labs and discussed the procedure including the risks, benefits and alternatives for the proposed anesthesia with the patient or authorized  representative who has indicated his/her understanding and acceptance.  Plan Discussed with: Anesthesiologist  Anesthesia Plan Comments: (I have discussed risks of neuraxial anesthesia including but not limited to infection, bleeding, nerve injury, back pain, headache, seizures, and failure of block. Patient denies bleeding disorders and is not currently anticoagulated. Labs have been reviewed. Risks and benefits discussed. All patient's questions answered.  )         Anesthesia Quick Evaluation

## 2024-05-01 NOTE — Progress Notes (Signed)
 Sherrell, CNM noted patient is OP on exam and by US . I confirmed this by my exam as well (ROP). Patient consented to manual rotation. Rotated easily to LOA on 1st attempt. Held along suture line until next contraction. She pushed and made excellent progress. Head held in position along suture through 2 more contractions and she again made excellent progress, pushing to +2. Head was still in LOA position. Patient will continue to push - encouraged her same excellent effort. IUPC removed as it was malpositioned. Unable to replace as cervix now completely dilated.   Vina Solian, MD Attending Obstetrician & Gynecologist, [facility] for [facility], [facility]

## 2024-05-01 NOTE — Plan of Care (Signed)
  Problem: Education: Goal: Knowledge of disease or condition will improve Outcome: Progressing Goal: Knowledge of the prescribed therapeutic regimen will improve Outcome: Progressing   Problem: Fluid Volume: Goal: Peripheral tissue perfusion will improve Outcome: Progressing   Problem: Clinical Measurements: Goal: Complications related to disease process, condition or treatment will be avoided or minimized Outcome: Progressing   Problem: Education: Goal: Knowledge of General Education information will improve Description: Including pain rating scale, medication(s)/side effects and non-pharmacologic comfort measures Outcome: Progressing   Problem: Health Behavior/Discharge Planning: Goal: Ability to manage health-related needs will improve Outcome: Progressing   Problem: Clinical Measurements: Goal: Ability to maintain clinical measurements within normal limits will improve Outcome: Progressing Goal: Will remain free from infection Outcome: Progressing Goal: Diagnostic test results will improve Outcome: Progressing Goal: Respiratory complications will improve Outcome: Progressing Goal: Cardiovascular complication will be avoided Outcome: Progressing   Problem: Activity: Goal: Risk for activity intolerance will decrease Outcome: Progressing   Problem: Nutrition: Goal: Adequate nutrition will be maintained Outcome: Progressing   Problem: Coping: Goal: Level of anxiety will decrease Outcome: Progressing   Problem: Elimination: Goal: Will not experience complications related to bowel motility Outcome: Progressing Goal: Will not experience complications related to urinary retention Outcome: Progressing   Problem: Pain Managment: Goal: General experience of comfort will improve and/or be controlled Outcome: Progressing   Problem: Safety: Goal: Ability to remain free from injury will improve Outcome: Progressing   Problem: Skin Integrity: Goal: Risk for impaired  skin integrity will decrease Outcome: Progressing   Problem: Education: Goal: Knowledge of Childbirth will improve Outcome: Progressing Goal: Ability to make informed decisions regarding treatment and plan of care will improve Outcome: Progressing Goal: Ability to state and carry out methods to decrease the pain will improve Outcome: Progressing Goal: Individualized Educational Video(s) Outcome: Progressing   Problem: Coping: Goal: Ability to verbalize concerns and feelings about labor and delivery will improve Outcome: Progressing   Problem: Life Cycle: Goal: Ability to make normal progression through stages of labor will improve Outcome: Progressing Goal: Ability to effectively push during vaginal delivery will improve Outcome: Progressing   Problem: Role Relationship: Goal: Will demonstrate positive interactions with the child Outcome: Progressing   Problem: Safety: Goal: Risk of complications during labor and delivery will decrease Outcome: Progressing   Problem: Pain Management: Goal: Relief or control of pain from uterine contractions will improve Outcome: Progressing

## 2024-05-01 NOTE — Progress Notes (Signed)
 LABOR PROGRESS NOTE  Patient Name: Deanna Mckay, female   DOB: 12-Oct-2000, 23 y.o.  MRN: [id number]  Patient comfortable s/p epidural. Amenable to exam. Now 7/90/0. Babe asynclitic with more cervix maternal right. Proceed with position changes. Continue to titrate pit PRN. Cat I.  #gHTN: Mild range Bps. No severe features  [name], MD

## 2024-05-01 NOTE — Progress Notes (Signed)
 LABOR PROGRESS NOTE Pt rechecked at 05/01/2024 0115 Patient comfortable without epidural. Pit at 6. AROM cf, by Camie HOWARD SCE: 4/80/-1 per Camie CNM FHT: baseline 120, mod variability, +accels, -decels; overall category I. Toco: q2 min  A/P:  Continue to titrate pit per protocol Ambulate, birthing ball, etc Prefer non-epidural pain control Recheck in 4h   [employer], DO 1:36 AM

## 2024-05-02 ENCOUNTER — Encounter (HOSPITAL_COMMUNITY): Payer: Self-pay | Admitting: Obstetrics and Gynecology

## 2024-05-02 DIAGNOSIS — O9832 Other infections with a predominantly sexual mode of transmission complicating childbirth: Secondary | ICD-10-CM

## 2024-05-02 DIAGNOSIS — Z3A37 37 weeks gestation of pregnancy: Secondary | ICD-10-CM

## 2024-05-02 DIAGNOSIS — O99344 Other mental disorders complicating childbirth: Secondary | ICD-10-CM

## 2024-05-02 DIAGNOSIS — O1414 Severe pre-eclampsia complicating childbirth: Secondary | ICD-10-CM

## 2024-05-02 DIAGNOSIS — O99214 Obesity complicating childbirth: Secondary | ICD-10-CM

## 2024-05-02 LAB — COMPREHENSIVE METABOLIC PANEL WITH GFR
ALT: 16 U/L (ref 0–44)
ALT: 17 U/L (ref 0–44)
AST: 30 U/L (ref 15–41)
AST: 28 U/L (ref 15–41)
Albumin: 2.1 g/dL — ABNORMAL LOW (ref 3.5–5.0)
Albumin: 1.9 g/dL — ABNORMAL LOW (ref 3.5–5.0)
Alkaline Phosphatase: 122 U/L (ref 38–126)
Alkaline Phosphatase: 117 U/L (ref 38–126)
Anion gap: 10 (ref 5–15)
Anion gap: 6 (ref 5–15)
BUN: 7 mg/dL (ref 6–20)
BUN: 7 mg/dL (ref 6–20)
CO2: 20 mmol/L — ABNORMAL LOW (ref 22–32)
CO2: 22 mmol/L (ref 22–32)
Calcium: 8.1 mg/dL — ABNORMAL LOW (ref 8.9–10.3)
Calcium: 7.6 mg/dL — ABNORMAL LOW (ref 8.9–10.3)
Chloride: 105 mmol/L (ref 98–111)
Chloride: 106 mmol/L (ref 98–111)
Creatinine, Ser: 1.18 mg/dL — ABNORMAL HIGH (ref 0.44–1.00)
Creatinine, Ser: 1.22 mg/dL — ABNORMAL HIGH (ref 0.44–1.00)
GFR, Estimated: >60 mL/min (ref >60)
GFR, Estimated: >60 mL/min (ref >60)
Glucose, Bld: 104 mg/dL — ABNORMAL HIGH (ref 70–99)
Glucose, Bld: 103 mg/dL — ABNORMAL HIGH (ref 70–99)
Potassium: 3.2 mmol/L — ABNORMAL LOW (ref 3.5–5.1)
Potassium: 4 mmol/L (ref 3.5–5.1)
Sodium: 135 mmol/L (ref 135–145)
Sodium: 134 mmol/L — ABNORMAL LOW (ref 135–145)
Total Bilirubin: 0.8 mg/dL (ref 0.0–1.2)
Total Bilirubin: 0.3 mg/dL (ref 0.0–1.2)
Total Protein: 5.1 g/dL — ABNORMAL LOW (ref 6.5–8.1)
Total Protein: 4.8 g/dL — ABNORMAL LOW (ref 6.5–8.1)

## 2024-05-02 LAB — CBC
HCT: 26.2 % — ABNORMAL LOW (ref 36.0–46.0)
HCT: 24 % — ABNORMAL LOW (ref 36.0–46.0)
Hemoglobin: 8.6 g/dL — ABNORMAL LOW (ref 12.0–15.0)
Hemoglobin: 7.7 g/dL — ABNORMAL LOW (ref 12.0–15.0)
MCH: 30.6 pg (ref 26.0–34.0)
MCH: 30.6 pg (ref 26.0–34.0)
MCHC: 32.8 g/dL (ref 30.0–36.0)
MCHC: 32.1 g/dL (ref 30.0–36.0)
MCV: 93.2 fL (ref 80.0–100.0)
MCV: 95.2 fL (ref 80.0–100.0)
Platelets: 270 K/uL (ref 150–400)
Platelets: 254 K/uL (ref 150–400)
RBC: 2.81 MIL/uL — ABNORMAL LOW (ref 3.87–5.11)
RBC: 2.52 MIL/uL — ABNORMAL LOW (ref 3.87–5.11)
RDW: 15.8 % — ABNORMAL HIGH (ref 11.5–15.5)
RDW: 15.9 % — ABNORMAL HIGH (ref 11.5–15.5)
WBC: 21.4 K/uL — ABNORMAL HIGH (ref 4.0–10.5)
WBC: 19.7 K/uL — ABNORMAL HIGH (ref 4.0–10.5)
nRBC: 0 % (ref 0.0–0.2)
nRBC: 0 % (ref 0.0–0.2)

## 2024-05-02 LAB — MAGNESIUM
Magnesium: 4.9 mg/dL — ABNORMAL HIGH (ref 1.7–2.4)
Magnesium: 6.3 mg/dL (ref 1.7–2.4)

## 2024-05-02 MED ORDER — FERROUS SULFATE 325 (65 FE) MG PO TABS
325.0000 mg | ORAL_TABLET | ORAL | Status: DC
  Administered 2024-05-03: 325 mg via ORAL
  Filled 2024-05-02: qty 1

## 2024-05-02 MED ORDER — LACTATED RINGERS IV SOLN: INTRAVENOUS | Status: AC

## 2024-05-02 MED ORDER — FUROSEMIDE 20 MG PO TABS
20.0000 mg | ORAL_TABLET | Freq: Every day | ORAL | Status: DC
  Administered 2024-05-02 – 2024-05-04 (×3): 20 mg via ORAL
  Filled 2024-05-02 (×3): qty 1

## 2024-05-02 MED ORDER — OXYCODONE HCL 5 MG PO TABS: 10.0000 mg | ORAL_TABLET | ORAL | Status: DC | PRN

## 2024-05-02 MED ORDER — TETANUS-DIPHTH-ACELL PERTUSSIS 5-2.5-18.5 LF-MCG/0.5 IM SUSY: 0.5000 mL | PREFILLED_SYRINGE | Freq: Once | INTRAMUSCULAR | Status: DC

## 2024-05-02 MED ORDER — HYDRALAZINE HCL 20 MG/ML IJ SOLN: 10.0000 mg | INTRAMUSCULAR | Status: DC | PRN

## 2024-05-02 MED ORDER — IBUPROFEN 600 MG PO TABS
600.0000 mg | ORAL_TABLET | Freq: Four times a day (QID) | ORAL | Status: DC
  Administered 2024-05-02 – 2024-05-03 (×6): 600 mg via ORAL
  Filled 2024-05-02 (×6): qty 1

## 2024-05-02 MED ORDER — LABETALOL HCL 5 MG/ML IV SOLN: 40.0000 mg | INTRAVENOUS | Status: DC | PRN

## 2024-05-02 MED ORDER — FUROSEMIDE 20 MG PO TABS: 20.0000 mg | ORAL_TABLET | Freq: Every day | ORAL | Status: DC

## 2024-05-02 MED ORDER — LABETALOL HCL 5 MG/ML IV SOLN: 80.0000 mg | INTRAVENOUS | Status: DC | PRN

## 2024-05-02 MED ORDER — TRANEXAMIC ACID-NACL 1000-0.7 MG/100ML-% IV SOLN
INTRAVENOUS | Status: AC
  Filled 2024-05-02: qty 100

## 2024-05-02 MED ORDER — ONDANSETRON HCL 4 MG PO TABS: 4.0000 mg | ORAL_TABLET | ORAL | Status: DC | PRN

## 2024-05-02 MED ORDER — POTASSIUM CHLORIDE CRYS ER 20 MEQ PO TBCR: 20.0000 meq | EXTENDED_RELEASE_TABLET | Freq: Every day | ORAL | Status: DC

## 2024-05-02 MED ORDER — ACETAMINOPHEN 325 MG PO TABS
650.0000 mg | ORAL_TABLET | ORAL | Status: DC | PRN
Start: 2024-05-02 — End: 2024-05-04
  Administered 2024-05-04: 650 mg via ORAL
  Filled 2024-05-02: qty 2

## 2024-05-02 MED ORDER — MAGNESIUM SULFATE 40 GM/1000ML IV SOLN
1.0000 g/h | INTRAVENOUS | Status: AC
  Administered 2024-05-02 – 2024-05-03 (×2): 1 g/h via INTRAVENOUS
  Filled 2024-05-02: qty 1000

## 2024-05-02 MED ORDER — PRENATAL MULTIVITAMIN CH
1.0000 | ORAL_TABLET | Freq: Every day | ORAL | Status: DC
  Administered 2024-05-02 – 2024-05-03 (×2): 1 via ORAL
  Filled 2024-05-02 (×2): qty 1

## 2024-05-02 MED ORDER — MAGNESIUM SULFATE BOLUS VIA INFUSION
4.0000 g | Freq: Once | INTRAVENOUS | Status: AC
  Administered 2024-05-02: 4 g via INTRAVENOUS
  Filled 2024-05-02: qty 1000

## 2024-05-02 MED ORDER — LABETALOL HCL 5 MG/ML IV SOLN: 20.0000 mg | INTRAVENOUS | Status: DC | PRN

## 2024-05-02 MED ORDER — SENNOSIDES-DOCUSATE SODIUM 8.6-50 MG PO TABS
2.0000 | ORAL_TABLET | Freq: Every day | ORAL | Status: DC
  Administered 2024-05-03: 2 via ORAL
  Filled 2024-05-02: qty 2

## 2024-05-02 MED ORDER — MISOPROSTOL 200 MCG PO TABS
800.0000 ug | ORAL_TABLET | Freq: Once | ORAL | Status: AC
  Administered 2024-05-02: 800 ug via RECTAL

## 2024-05-02 MED ORDER — DIPHENHYDRAMINE HCL 25 MG PO CAPS
25.0000 mg | ORAL_CAPSULE | Freq: Four times a day (QID) | ORAL | Status: DC | PRN
Start: 2024-05-02 — End: 2024-05-04

## 2024-05-02 MED ORDER — WITCH HAZEL-GLYCERIN EX PADS: 1.0000 | MEDICATED_PAD | CUTANEOUS | Status: DC | PRN

## 2024-05-02 MED ORDER — LABETALOL HCL 200 MG PO TABS
200.0000 mg | ORAL_TABLET | Freq: Two times a day (BID) | ORAL | Status: DC
  Administered 2024-05-02 – 2024-05-04 (×5): 200 mg via ORAL
  Filled 2024-05-02 (×5): qty 1

## 2024-05-02 MED ORDER — DIBUCAINE (PERIANAL) 1 % EX OINT: 1.0000 | TOPICAL_OINTMENT | CUTANEOUS | Status: DC | PRN

## 2024-05-02 MED ORDER — TRANEXAMIC ACID-NACL 1000-0.7 MG/100ML-% IV SOLN
1000.0000 mg | INTRAVENOUS | Status: AC
  Administered 2024-05-02: 1000 mg via INTRAVENOUS

## 2024-05-02 MED ORDER — COCONUT OIL OIL: 1.0000 | TOPICAL_OIL | Status: DC | PRN

## 2024-05-02 MED ORDER — ONDANSETRON HCL 4 MG/2ML IJ SOLN: 4.0000 mg | INTRAMUSCULAR | Status: DC | PRN

## 2024-05-02 MED ORDER — MAGNESIUM SULFATE 40 GM/1000ML IV SOLN
2.0000 g/h | INTRAVENOUS | Status: DC
  Administered 2024-05-02: 2 g/h via INTRAVENOUS
  Filled 2024-05-02: qty 1000

## 2024-05-02 MED ORDER — CEFAZOLIN SODIUM-DEXTROSE 2-4 GM/100ML-% IV SOLN
2.0000 g | Freq: Once | INTRAVENOUS | Status: AC
  Administered 2024-05-02: 2 g via INTRAVENOUS
  Filled 2024-05-02: qty 100

## 2024-05-02 MED ORDER — POTASSIUM CHLORIDE CRYS ER 20 MEQ PO TBCR
20.0000 meq | EXTENDED_RELEASE_TABLET | Freq: Every day | ORAL | Status: DC
  Administered 2024-05-02 – 2024-05-04 (×3): 20 meq via ORAL
  Filled 2024-05-02 (×3): qty 1

## 2024-05-02 MED ORDER — ZOLPIDEM TARTRATE 5 MG PO TABS: 5.0000 mg | ORAL_TABLET | Freq: Every evening | ORAL | Status: DC | PRN

## 2024-05-02 MED ORDER — MISOPROSTOL 200 MCG PO TABS
ORAL_TABLET | ORAL | Status: AC
  Filled 2024-05-02: qty 4

## 2024-05-02 MED ORDER — OXYCODONE HCL 5 MG PO TABS: 5.0000 mg | ORAL_TABLET | ORAL | Status: DC | PRN

## 2024-05-02 MED ORDER — SIMETHICONE 80 MG PO CHEW
80.0000 mg | CHEWABLE_TABLET | ORAL | Status: DC | PRN
Start: 2024-05-02 — End: 2024-05-04

## 2024-05-02 MED ORDER — BENZOCAINE-MENTHOL 20-0.5 % EX AERO
1.0000 | INHALATION_SPRAY | CUTANEOUS | Status: DC | PRN
  Filled 2024-05-02: qty 56

## 2024-05-02 NOTE — Discharge Summary (Signed)
 Postpartum Discharge Summary  Date of Service updated***     Patient Name: Deanna Mckay DOB: [birth year] MRN: [id number]  Date of admission: 04/30/2024 Delivery date:05/02/2024 Delivering provider: NEWTON MERING Date of discharge: 05/02/2024  Admitting diagnosis: Encounter for induction of labor [Z34.90] Gestational hypertension [O13.9] Intrauterine pregnancy: [redacted]w[redacted]d     Secondary diagnosis:  Principal Problem:   Encounter for induction of labor Active Problems:   Generalized anxiety disorder   Genital herpes   Rubella non-immune status, antepartum   Gestational hypertension  Additional problems: ***    Discharge diagnosis: {DX.:23714}                                              Post partum procedures:{Postpartum procedures:23558} Augmentation: AROM, Pitocin , Cytotec , and IP Foley Complications: {OB Labor/Delivery Complications:20784}  Hospital course: Induction of Labor With Vaginal Delivery   23 y.o. yo G1P0000 at [redacted]w[redacted]d was admitted to the hospital 04/30/2024 for induction of labor.  Indication for induction: Gestational hypertension.  Patient had an labor course complicated by nothing. She progressed well. After she delivered she had persistent severe range blood pressures so Magnesium  was recommended and given.  Membrane Rupture Time/Date: 1:31 AM,05/01/2024  Delivery Method:Vaginal, Spontaneous Operative Delivery:N/A Episiotomy: None Lacerations:    Details of delivery can be found in separate delivery note.  Patient had a postpartum course complicated by PreE with SF by BP. She was given Magnesium  for 24 hours and started on Potassium and Lasix . ***. Patient is discharged home 05/02/24.  Newborn Data: Birth date:05/02/2024 Birth time:2:19 AM Gender:Female Living status:Living Apgars:8 ,9  Weight:   Magnesium  Sulfate received: Yes: Seizure prophylaxis BMZ received: No Rhophylac:N/A MMR: *** T-DaP:{Tdap:23962} Flu: No RSV Vaccine received:  No Transfusion:{Transfusion received:30440034}  Immunizations received: Immunization History  Administered Date(s) Administered   HPV Quadrivalent 04/11/2013, 06/12/2013, 04/17/2014   Hepatitis A 10/19/2007   Meningococcal Conjugate 04/11/2013   Meningococcal Mcv4o 11/18/2018   PFIZER Comirnaty(Gray Top)Covid-19 Tri-Sucrose Vaccine 03/06/2021   PFIZER(Purple Top)SARS-COV-2 Vaccination 05/21/2020, 06/27/2020   Pfizer Covid-19 Vaccine Bivalent Booster 19yrs & up 07/24/2021   Tdap 04/11/2013, 06/21/2023    Physical exam  Vitals:   05/02/24 0230 05/02/24 0245 05/02/24 0300 05/02/24 0315  BP: (!) 153/87 (!) 159/87 (!) 165/102 (!) 162/110  Pulse: (!) 119 (!) 120 (!) 128 (!) 108  Resp:      Temp:      TempSrc:      SpO2:      Weight:      Height:       General: {Exam; general:21111117} Lochia: {Desc; appropriate/inappropriate:30686::appropriate} Uterine Fundus: {Desc; firm/soft:30687} Incision: {Exam; incision:21111123} DVT Evaluation: {Exam; dvt:2111122} Labs: Lab Results  Component Value Date   WBC 9.6 05/01/2024   HGB 9.1 (L) 05/01/2024   HCT 28.7 (L) 05/01/2024   MCV 95.7 05/01/2024   PLT 267 05/01/2024      Latest Ref Rng & Units 04/30/2024    1:09 AM  CMP  Glucose 70 - 99 mg/dL 84   BUN 6 - 20 mg/dL 5   Creatinine 9.55 - 8.99 mg/dL 9.15   Sodium 864 - 854 mmol/L 140   Potassium 3.5 - 5.1 mmol/L 3.9   Chloride 98 - 111 mmol/L 107   CO2 22 - 32 mmol/L 21   Calcium  8.9 - 10.3 mg/dL 8.8   Total Protein 6.5 - 8.1 g/dL 6.4  Total Bilirubin 0.0 - 1.2 mg/dL 0.4   Alkaline Phos 38 - 126 U/L 161   AST 15 - 41 U/L 20   ALT 0 - 44 U/L 18    Edinburgh Score:     No data to display         No data recorded  After visit meds:  Allergies as of 05/02/2024       Reactions   Other Shortness Of Breath, Other (See Comments)   Any type of BEANS exacerbate the patient's asthma   Trichophyton Shortness Of Breath, Other (See Comments)   Wheezing Wheezing    Midazolam  Hcl Nausea And Vomiting   Penicillins Hives, Rash   Has patient had a PCN reaction causing immediate rash, facial/tongue/throat swelling, SOB or lightheadedness with hypotension: Yes Has patient had a PCN reaction causing severe rash involving mucus membranes or skin necrosis: No Has patient had a PCN reaction that required hospitalization: No Has patient had a PCN reaction occurring within the last 10 years: No If all of the above answers are NO, then may proceed with Cephalosporin use. Has patient had a PCN reaction causing immediate rash, facial/tongue/throat swelling, SOB or lightheadedness with hypotension: Yes Has patient had a PCN reaction causing severe rash involving mucus membranes or skin necrosis: No Has patient had a PCN reaction that required hospitalization: No Has patient had a PCN reaction occurring within the last 10 years: No If all of the above answers are NO, then may proceed with Cephalosporin use.   Soy Allergy (obsolete) Other (See Comments)   WHEEZING/EXACERBATES ASTHMA WHEEZING/EXACERBATES ASTHMA   Versed  [midazolam  Hcl] Nausea And Vomiting   Ranitidine  Hcl Rash   Zantac  [ranitidine  Hcl] Rash     Med Rec must be completed prior to using this SMARTLINK***        Discharge home in stable condition Infant Feeding: Breast Infant Disposition:{CHL IP OB HOME WITH FNUYZM:76418} Discharge instruction: per After Visit Summary and Postpartum booklet. Activity: Advance as tolerated. Pelvic rest for 6 weeks.  Diet: routine diet Future Appointments: Future Appointments  Date Time Provider Department Center  05/09/2024  3:50 PM Abigail Rollo DASEN, MD [facility] None  08/29/2024  2:45 PM Buck Saucer, MD [facility] None   Follow up Visit:  Follow-up Information     [facility] Follow up in 1 week(s).   Specialty: Obstetrics and Gynecology Contact information: [address], [address] [place]  [place] [address] [phone]               Message sent to [facility] on 8/19 by Dr. Cleatus  Please schedule this patient for a In person postpartum visit in 6 weeks with the following provider: Any provider. Additional Postpartum F/U:BP check 1 week  High risk pregnancy complicated by: HTN Delivery mode:  Vaginal, Spontaneous Anticipated Birth Control:  Unsure   05/02/2024 Vina Cleatus, MD

## 2024-05-02 NOTE — Lactation Note (Signed)
 This note was copied from a baby's chart. Lactation Consultation Note  Patient Name: Deanna Mckay Unijb'd Date: 05/02/2024 Age:22 hours Reason for consult: Initial assessment;Primapara;1st time breastfeeding;Early term 37-38.6wks;Other (Comment);RN request (gHTN, Pre-E, 675 cc PPH)  Visited with family of 39 9/69 weeks old female [place]; Ms. Carmack is a P1 and reported (+) breast changes during the pregnancy she's been formula feeding so far, her plan is to do both, breast and bottle feeding. Offered to set up a DEBP, she'll think about it since baby is less < 24 hours old. Noticed that baby has been drinking larger amounts on earlier feedings, parents trying to wake him up because he was due to eat again. Offered assistance with latch and parents agreed to wake him up to feed. This LC took Jabari to the R side in cross cradle hold first and then in football; he showed subtle feeding cues, but not opening her mouth wide enough for a deep latch. Showed mom how to do the teacup hold to get baby to latch and he did briefly, on and off, he kept slipping off the breast. When doing suck training noticed an uncoordinated sucking pattern, he would bite and chomp mainly. Once he fell asleep at the breast, placed him STS on mother's chest. He started rooting again, parents wished to try a bottle, showed them how to pace feed and then burped and swaddled baby. Reviewed normal ETI behavior, feeding cues, cluster feeding, lactogenesis II, size of baby's stomach, supplementation and anticipatory guidelines.  Maternal Data Has patient been taught Hand Expression?: Yes Does the patient have breastfeeding experience prior to this delivery?: No  Feeding Mother's Current Feeding Choice: Breast Milk and Formula Nipple Type: Slow - flow  LATCH Score Latch: Repeated attempts needed to sustain latch, nipple held in mouth throughout feeding, stimulation needed to elicit sucking reflex.  Audible Swallowing:  None  Type of Nipple: Everted at rest and after stimulation (short shafted)  Comfort (Breast/Nipple): Soft / non-tender  Hold (Positioning): Assistance needed to correctly position infant at breast and maintain latch.  LATCH Score: 6  Interventions Interventions: Breast feeding basics reviewed;DEBP;Education;LC Services brochure  Discharge WIC Program: Yes [place]  Plan STS whenever possible Massage and hand express both breasts, prior feedings Put baby to breast +8 times/24 hours or sooner if feeding cues are present Parents will continue supplementing with Similac 20 calorie formula; will follow supplementation guidelines per baby's age in hours Will consider start pumping tomorrow if baby is still not latching on after 24 hours  FOB present and supportive. All questions and concerns answered, family to contact [facility] services PRN.  Consult Status Consult Status: Follow-up Date: 05/03/24 Follow-up type: In-patient   Keianna Signer GORMAN Crate 05/02/2024, 5:05 PM

## 2024-05-02 NOTE — Anesthesia Postprocedure Evaluation (Signed)
 Anesthesia Post Note  Patient: Deanna Mckay  Procedure(s) Performed: AN AD HOC LABOR EPIDURAL     Patient location during evaluation: OB High Risk Anesthesia Type: Epidural Level of consciousness: awake, oriented and awake and alert Pain management: pain level controlled Vital Signs Assessment: post-procedure vital signs reviewed and stable Respiratory status: spontaneous breathing, nonlabored ventilation and respiratory function stable Cardiovascular status: stable Postop Assessment: patient able to bend at knees, adequate PO intake, able to ambulate, no apparent nausea or vomiting and no headache Anesthetic complications: no   No notable events documented.  Last Vitals:  Vitals:   05/02/24 1157 05/02/24 1215  BP: 120/76   Pulse: (!) 103   Resp: 16 16  Temp: 36.7 C   SpO2: 100%     Last Pain:  Vitals:   05/02/24 1435  TempSrc:   PainSc: 4    Pain Goal:                   Bilal Manzer

## 2024-05-02 NOTE — Progress Notes (Shared)
 POSTPARTUM PROGRESS NOTE  Post Partum Day 0  Subjective:  Deanna Mckay is a 23 y.o. G1P1001 s/p NSVD at [redacted]w[redacted]d.  She reports she is doing well. No acute events overnight. She denies any problems with ambulating, voiding or po intake. Denies nausea or vomiting.  Pain is moderately controlled.  Lochia is quantity of a period; appropriate.  Objective: Blood pressure (!) 141/87, pulse (!) 115, temperature 99.1 F (37.3 C), temperature source Oral, resp. rate 16, height 5' 4 (1.626 m), weight 108 kg, last menstrual period 08/11/2023, SpO2 100%, unknown if currently breastfeeding.  Physical Exam:  General: alert, cooperative and no distress Chest: no respiratory distress Heart:regular rate, distal pulses intact Uterine Fundus: firm, appropriately tender DVT Evaluation: No calf swelling or tenderness Extremities: 1+ edema Skin: warm, dry  Recent Labs    04/30/24 0109 05/01/24 0214  HGB 10.0* 9.1*  HCT 31.1* 28.7*    Assessment/Plan: Deanna Mckay is a 23 y.o. G1P1001 s/p NSVD at [redacted]w[redacted]d s/p IOL for gHTN  PPD#0 - Doing well  Routine postpartum care Pre-E with severe features -now on magnesium , procardia , lasix  and potassium -CTM pressures Desires Circ: consented; see baby's chart Contraception: condoms Feeding: tried breastfeeding but was uncomfortable and would like to switch to formula. Willing to have lactation consultation Dispo: Plan for discharge pending 24 hours of mag and pre-e status.   LOS: 2 days   Coolidge Moros, MD 05/02/2024, 8:38 AM

## 2024-05-03 DIAGNOSIS — N179 Acute kidney failure, unspecified: Secondary | ICD-10-CM | POA: Diagnosis not present

## 2024-05-03 LAB — COMPREHENSIVE METABOLIC PANEL WITH GFR
ALT: 16 U/L (ref 0–44)
AST: 25 U/L (ref 15–41)
Albumin: 2 g/dL — ABNORMAL LOW (ref 3.5–5.0)
Alkaline Phosphatase: 112 U/L (ref 38–126)
Anion gap: 9 (ref 5–15)
BUN: 10 mg/dL (ref 6–20)
CO2: 21 mmol/L — ABNORMAL LOW (ref 22–32)
Calcium: 7.3 mg/dL — ABNORMAL LOW (ref 8.9–10.3)
Chloride: 103 mmol/L (ref 98–111)
Creatinine, Ser: 1.23 mg/dL — ABNORMAL HIGH (ref 0.44–1.00)
GFR, Estimated: >60 mL/min (ref >60)
Glucose, Bld: 101 mg/dL — ABNORMAL HIGH (ref 70–99)
Potassium: 3.5 mmol/L (ref 3.5–5.1)
Sodium: 133 mmol/L — ABNORMAL LOW (ref 135–145)
Total Bilirubin: 0.2 mg/dL (ref 0.0–1.2)
Total Protein: 4.8 g/dL — ABNORMAL LOW (ref 6.5–8.1)

## 2024-05-03 LAB — CBC
HCT: 23.3 % — ABNORMAL LOW (ref 36.0–46.0)
Hemoglobin: 7.5 g/dL — ABNORMAL LOW (ref 12.0–15.0)
MCH: 30.7 pg (ref 26.0–34.0)
MCHC: 32.2 g/dL (ref 30.0–36.0)
MCV: 95.5 fL (ref 80.0–100.0)
Platelets: 238 K/uL (ref 150–400)
RBC: 2.44 MIL/uL — ABNORMAL LOW (ref 3.87–5.11)
RDW: 16.3 % — ABNORMAL HIGH (ref 11.5–15.5)
WBC: 16.4 K/uL — ABNORMAL HIGH (ref 4.0–10.5)
nRBC: 0 % (ref 0.0–0.2)

## 2024-05-03 LAB — MAGNESIUM: Magnesium: 5.1 mg/dL — ABNORMAL HIGH (ref 1.7–2.4)

## 2024-05-03 MED ORDER — SODIUM CHLORIDE 0.9 % IV SOLN
500.0000 mg | Freq: Once | INTRAVENOUS | Status: AC
  Administered 2024-05-03: 500 mg via INTRAVENOUS
  Filled 2024-05-03: qty 25

## 2024-05-03 MED ORDER — IBUPROFEN 600 MG PO TABS: 600.0000 mg | ORAL_TABLET | Freq: Four times a day (QID) | ORAL | Status: DC | PRN

## 2024-05-03 NOTE — Lactation Note (Signed)
 This note was copied from a baby's chart. Lactation Consultation Note  Patient Name: Deanna Mckay Unijb'd Date: 05/03/2024 Age:22 hours  Reason for consult: Follow-up assessment;Primapara;1st time breastfeeding;Early term 37-38.6wks  P1, [redacted]w[redacted]d, ETI, mom is post Mag for elevated BP  LC visit to see [name] and her baby Deanna  [place]. Baby was in the [facility] for his circumcision. Baby has been formula feeding and tolerating well. Mother indicates that she wants to breast feed her baby and he has not latched. 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    Instructed mother to call when baby returns to room and showing feedings after his circumcision. Informed baby may be sleepy for a few hours after his procedure. Mother wanted to pump to stimulate her milk production The DEBP set up and mother was instructed how to also make a manual pump from the kit. Instructed mother on the use, frequency, cleaning of breast pump and storage of breast milk. She was fitted with 18 mm flanges. Instructed to pump every 3 hrs to stimulate her milk production.   Mother wants a breast pump for home. She is trying to contact her insurance for a pump. She also spoke with WIC while LC was in her room and requested a breast pump for home use. WIC to call her back and inform if she is eligible. Manual pump has been given with instructions.    Feeding Mother's Current Feeding Choice: Breast Milk and Formula Nipple Type: Extra Slow Flow  Lactation Tools Discussed/Used Tools: Pump;Flanges Flange Size: 18 Breast pump type: Manual;Double-Electric Breast Pump Pump Education: Setup, frequency, and cleaning;Milk Storage Reason for Pumping: stimulate milk production Pumping frequency: every 3 hrs for 15 min  Interventions Interventions: DEBP;Education;Pace feeding;CDC milk storage guidelines;CDC Guidelines for Breast Pump Cleaning  Discharge Pump: Manual  [facility] referral sent on mother's behalf)  Consult Status Consult Status: Follow-up Date: 05/04/24 Follow-up type: In-patient    Joshua Line M 05/03/2024, 10:30 AM

## 2024-05-03 NOTE — Progress Notes (Signed)
 Post Partum Day 1 Subjective: Patient is doing well this morning and is without any complaints. She denies HA, visual changes, RUQ/epigastric pain, nausea or emesis. Patient has been ambulating and denies feeling dizzy or lightheaded.   Objective: Blood pressure 105/60, pulse 76, temperature 98.3 F (36.8 C), temperature source Oral, resp. rate 16, height 5' 4 (1.626 m), weight 108 kg, last menstrual period 08/11/2023, SpO2 99%, unknown if currently breastfeeding.  Physical Exam:  General: alert, cooperative, and no distress Lochia: appropriate Uterine Fundus: firm DVT Evaluation: No evidence of DVT seen on physical exam.  Recent Labs    05/02/24 1752 05/03/24 0422  HGB 7.7* 7.5*  HCT 24.0* 23.3*    Assessment/Plan: 23 yo P1 s/p SVD with preeclampsia - Patient completed magnesium  sulfate for seizure prophylaxis - BP stable on labetalol  and lasix  - Continue close monitoring - Patient with acute blood loss anemia- agrees to venofer       LOS: 3 days   Winton Felt, MD 05/03/2024, 7:30 AM

## 2024-05-03 NOTE — Clinical Social Work Maternal (Signed)
 CLINICAL SOCIAL WORK MATERNAL/CHILD NOTE  Patient Details  Name: Deanna Mckay MRN: [id number] Date of Birth: [birth year]  Date:  05/03/2024  Clinical Social Worker Initiating Note:  Suzen Law, [place] Date/Time: Initiated:  05/03/24/1109     Child's Name:  Deanna Mckay   Biological Parents:  Mother, Father (Father: Margert Mckay 07/24/02)   Need for Interpreter:  None   Reason for Referral:  Behavioral Health Concerns   Address:  [address] [place] [address]    Phone number:  [phone] (home)     Additional phone number:   Household Members/Support Persons (HM/SP):   Household Member/Support Person 1   HM/SP Name Relationship DOB or Age  HM/SP -1 Margert Mckay FOB 07/24/02  HM/SP -2        HM/SP -3        HM/SP -4        HM/SP -5        HM/SP -6        HM/SP -7        HM/SP -8          Natural Supports (not living in the home):  Parent, Immediate Family   Professional Supports: Other (Comment) (Psychiatrist: Dr. Sable)   Employment: Full-time   Type of Work: Aeronautical engineer   Education:  High school graduate   Homebound arranged:    Surveyor, quantity Resources:  Medicaid   Other Resources:  [facility]   Cultural/Religious Considerations Which May Impact Care:    Strengths:  Ability to meet basic needs  , Home prepared for child  , Psychotropic Medications, Pediatrician chosen   Psychotropic Medications:  Zoloft      Pediatrician:    [employer] area  Pediatrician List:   [facility]  [place]    [place]    [place] [facility]      Pediatrician Fax Number:    Risk Factors/Current Problems:  Mental Health Concerns     Cognitive State:  Able to Concentrate  , Alert  , Linear Thinking  , Goal Oriented     Mood/Affect:  Euthymic  , Comfortable  , Calm  , Interested     CSW Assessment: CSW met with MOB at  bedside to complete psychosocial assessment. CSW introduced self and requested to speak with MOB privately. MOB was accompanied by several guest and FOB who was asleep in the recliner. MOB asked all of her guests to leave the room and granted CSW verbal permission to speak in front of FOB about anything. MOB's guests left the room. CSW explained reason for consult. MOB was welcoming, pleasant, and remained engaged during assessment. MOB reported that she resides with FOB and is employed full time. MOB reported that they have all items needed to care for infant including a car seat and bassinet. CSW inquired about MOB's support system, MOB reported that her mom, sister, and FOB's mom are supports.   CSW inquired about MOB's mental health history. MOB reported that she was diagnosed with anxiety, depression, and PTSD in elementary school. MOB reported that she was diagnosed with Bipolar disorder in middle school. MOB denied any current mental health symptoms and reported that she is currently taking Zoloft, which is helpful. MOB reported that her medication is managed by her psychiatrist and verbalized a plan to continue taking medication. MOB reported that she sess her psychiatrist every 2-3 months. MOB reported that after  her postpartum period she plans to work with her psychiatrist to discontinue the use of all medication as she reports being on medication since age 24. MOB reported that prior to pregnancy she was on Abilify  and noted that it was not helpful. CSW inquired about any auditory or visual hallucinations in relation to MOB's Bipolar diagnosis, MOB denied any AVH. CSW inquired about MOB's mental health during pregnancy, MOB denied any mental health symptoms during pregnancy and reported feeling neutral. CSW inquired about MOB's coping skills aside from medication, MOB reported music and being active. CSW inquired about MOB's participation in therapy, MOB reported that she is not currently in therapy and  reported that she is interested. CSW provided MOB with local mental health resources. CSW informed MOB that she may be more susceptible to PPD due to her previous mental health history, MOB verbalized understanding.   CSW provided education regarding the baby blues period vs. perinatal mood disorders, discussed treatment and gave resources for mental health follow up if concerns arise.  CSW recommends self-evaluation during the postpartum time period using the New Mom Checklist from Postpartum Progress and encouraged MOB to contact a medical professional if symptoms are noted at any time.    CSW provided review of Sudden Infant Death Syndrome (SIDS) precautions.    CSW inquired about any additional needs for resources/supports, MOB reported no needs.   CSW identifies no further need for  intervention and no barriers to discharge at this time.   CSW Plan/Description:  Sudden Infant Death Syndrome (SIDS) Education, Perinatal Mood and Anxiety Disorder (PMADs) Education, No Further Intervention Required/No Barriers to Discharge, Other Information/Referral to [employer], LCSW 05/03/2024, 11:13 AM

## 2024-05-04 ENCOUNTER — Other Ambulatory Visit (HOSPITAL_COMMUNITY): Payer: Self-pay

## 2024-05-04 LAB — COMPREHENSIVE METABOLIC PANEL WITH GFR
ALT: 21 U/L (ref 0–44)
AST: 29 U/L (ref 15–41)
Albumin: 2.1 g/dL — ABNORMAL LOW (ref 3.5–5.0)
Alkaline Phosphatase: 109 U/L (ref 38–126)
Anion gap: 12 (ref 5–15)
BUN: 11 mg/dL (ref 6–20)
CO2: 22 mmol/L (ref 22–32)
Calcium: 8.5 mg/dL — ABNORMAL LOW (ref 8.9–10.3)
Chloride: 109 mmol/L (ref 98–111)
Creatinine, Ser: 0.91 mg/dL (ref 0.44–1.00)
GFR, Estimated: >60 mL/min (ref >60)
Glucose, Bld: 78 mg/dL (ref 70–99)
Potassium: 4.4 mmol/L (ref 3.5–5.1)
Sodium: 143 mmol/L (ref 135–145)
Total Bilirubin: 0.6 mg/dL (ref 0.0–1.2)
Total Protein: 5.1 g/dL — ABNORMAL LOW (ref 6.5–8.1)

## 2024-05-04 MED ORDER — POTASSIUM CHLORIDE CRYS ER 20 MEQ PO TBCR
20.0000 meq | EXTENDED_RELEASE_TABLET | Freq: Every day | ORAL | 0 refills | Status: DC
  Filled 2024-05-04: qty 3, 3d supply, fill #0

## 2024-05-04 MED ORDER — MEASLES, MUMPS & RUBELLA VAC IJ SOLR
0.5000 mL | Freq: Once | INTRAMUSCULAR | Status: AC
  Administered 2024-05-04: 0.5 mL via SUBCUTANEOUS
  Filled 2024-05-04: qty 0.5

## 2024-05-04 MED ORDER — ACCRUFER 30 MG PO CAPS
1.0000 | ORAL_CAPSULE | ORAL | 0 refills | Status: AC
  Filled 2024-05-04: qty 15, 30d supply, fill #0

## 2024-05-04 MED ORDER — IBUPROFEN 600 MG PO TABS
600.0000 mg | ORAL_TABLET | Freq: Four times a day (QID) | ORAL | 0 refills | Status: AC | PRN
  Filled 2024-05-04: qty 30, 8d supply, fill #0

## 2024-05-04 MED ORDER — ACETAMINOPHEN 325 MG PO TABS: 650.0000 mg | ORAL_TABLET | ORAL | Status: AC | PRN

## 2024-05-04 MED ORDER — FUROSEMIDE 20 MG PO TABS
20.0000 mg | ORAL_TABLET | Freq: Every day | ORAL | 0 refills | Status: DC
  Filled 2024-05-04: qty 3, 3d supply, fill #0

## 2024-05-04 MED ORDER — LABETALOL HCL 200 MG PO TABS
200.0000 mg | ORAL_TABLET | Freq: Two times a day (BID) | ORAL | 1 refills | Status: DC
  Filled 2024-05-04: qty 42, 21d supply, fill #0

## 2024-05-09 ENCOUNTER — Ambulatory Visit (INDEPENDENT_AMBULATORY_CARE_PROVIDER_SITE_OTHER): Payer: MEDICAID

## 2024-05-09 ENCOUNTER — Encounter: Payer: MEDICAID | Admitting: Obstetrics and Gynecology

## 2024-05-09 VITALS — BP 139/88 | HR 82

## 2024-05-09 DIAGNOSIS — O1413 Severe pre-eclampsia, third trimester: Secondary | ICD-10-CM

## 2024-05-09 NOTE — Progress Notes (Signed)
   Subjective:  Deanna Mckay is a G1P1001 here for BP check.  She is 1 week postpartum following a normal spontaneous vaginal delivery. Compliant with medication use. Has not been monitoring her BP at home since discharge.   Hypertension ROS: Patient has visual changes. Blurred vision in left eye, despite wearing glasses.   Objective:  BP 139/88   Pulse 82   LMP 08/11/2023 (Exact Date)   Appearance alert, well appearing, and in no distress.  Assessment:   Blood Pressure borderline controlled.   Plan:  Consulted with Dr. Abigail. Will obtain CMP and CBC labs today. Follow up in 1 week for repeat BP check. Encouraged patient to resume monitoring her BP at home. Reviewed precautions that would warrant evaluation at [facility].   Margy LITTIE Pizza, RN

## 2024-05-10 ENCOUNTER — Ambulatory Visit: Payer: Self-pay | Admitting: Obstetrics and Gynecology

## 2024-05-10 LAB — COMPREHENSIVE METABOLIC PANEL WITH GFR
ALT: 31 IU/L (ref 0–32)
AST: 19 IU/L (ref 0–40)
Albumin: 3.9 g/dL — ABNORMAL LOW (ref 4.0–5.0)
Alkaline Phosphatase: 127 IU/L — ABNORMAL HIGH (ref 44–121)
BUN/Creatinine Ratio: 13 (ref 9–23)
BUN: 10 mg/dL (ref 6–20)
Bilirubin Total: <0.2 mg/dL (ref 0.0–1.2)
CO2: 21 mmol/L (ref 20–29)
Calcium: 9.3 mg/dL (ref 8.7–10.2)
Chloride: 105 mmol/L (ref 96–106)
Creatinine, Ser: 0.76 mg/dL (ref 0.57–1.00)
Globulin, Total: 2.7 g/dL (ref 1.5–4.5)
Glucose: 73 mg/dL (ref 70–99)
Potassium: 4.6 mmol/L (ref 3.5–5.2)
Sodium: 141 mmol/L (ref 134–144)
Total Protein: 6.6 g/dL (ref 6.0–8.5)
eGFR: 114 mL/min/1.73 (ref >59)

## 2024-05-10 LAB — CBC
Hematocrit: 32.3 % — ABNORMAL LOW (ref 34.0–46.6)
Hemoglobin: 9.9 g/dL — ABNORMAL LOW (ref 11.1–15.9)
MCH: 30.6 pg (ref 26.6–33.0)
MCHC: 30.7 g/dL — ABNORMAL LOW (ref 31.5–35.7)
MCV: 100 fL — ABNORMAL HIGH (ref 79–97)
Platelets: 488 x10E3/uL — ABNORMAL HIGH (ref 150–450)
RBC: 3.24 x10E6/uL — ABNORMAL LOW (ref 3.77–5.28)
RDW: 14.5 % (ref 11.7–15.4)
WBC: 8.1 x10E3/uL (ref 3.4–10.8)

## 2024-05-16 ENCOUNTER — Ambulatory Visit: Payer: MEDICAID

## 2024-05-16 NOTE — Progress Notes (Signed)
 Subjective:  Deanna Mckay is a G1P1001 here for BP check.  She is 2 weeks postpartum following a normal spontaneous vaginal delivery.    Hypertension ROS: Patient denies headache and visual changes   Objective:  BP 115/75   Pulse 95   LMP 08/11/2023 (Exact Date)   Appearance alert, well appearing, and in no distress.  Assessment:   Blood Pressure well controlled.   Plan:  Current treatment plan is effective, no change in therapy..   Taite Schoeppner N Charmine Bockrath, RN

## 2024-05-31 ENCOUNTER — Ambulatory Visit: Payer: MEDICAID | Admitting: Obstetrics and Gynecology

## 2024-06-05 ENCOUNTER — Encounter: Payer: Self-pay | Admitting: Physician Assistant

## 2024-06-05 ENCOUNTER — Other Ambulatory Visit (HOSPITAL_COMMUNITY)
Admission: RE | Admit: 2024-06-05 | Discharge: 2024-06-05 | Disposition: A | Discharge status: Home / Self Care | Payer: MEDICAID | Source: Ambulatory Visit | Attending: Physician Assistant | Admitting: Physician Assistant

## 2024-06-05 ENCOUNTER — Ambulatory Visit (INDEPENDENT_AMBULATORY_CARE_PROVIDER_SITE_OTHER): Payer: MEDICAID | Admitting: Physician Assistant

## 2024-06-05 DIAGNOSIS — R12 Heartburn: Secondary | ICD-10-CM | POA: Diagnosis not present

## 2024-06-05 DIAGNOSIS — R1012 Left upper quadrant pain: Secondary | ICD-10-CM

## 2024-06-05 DIAGNOSIS — O26893 Other specified pregnancy related conditions, third trimester: Secondary | ICD-10-CM

## 2024-06-05 DIAGNOSIS — O1495 Unspecified pre-eclampsia, complicating the puerperium: Secondary | ICD-10-CM

## 2024-06-05 DIAGNOSIS — K5900 Constipation, unspecified: Secondary | ICD-10-CM

## 2024-06-05 NOTE — Progress Notes (Unsigned)
 Post Partum Visit Note  Deanna Mckay is a 23 y.o. G59P1001 female who presents for a postpartum visit. She is 4 weeks postpartum following a normal spontaneous vaginal delivery.  I have fully reviewed the prenatal and intrapartum course. The delivery was at 37 gestational weeks.  Anesthesia: epidural. Postpartum course has been  good. Baby is doing well yes. Baby is feeding by breast and formula Similac Total Comfort. Bleeding thin lochia. Bowel function is some constipation. Bladder function is normal. Patient is not sexually active. Contraception method is abstinence. Postpartum depression screening: negative.  Patient reports dull, stabbing LUQ pain x 1 week. Worse after meals and after eating spicy foods. She does have constipation now for which she is taking miralax . This has happened to her before before she became pregnant. Patient denies fever, n/v, diarrhea.   The pregnancy intention screening data noted above was reviewed. Potential methods of contraception were discussed. The patient elected to proceed with No data recorded.   Edinburgh Postnatal Depression Scale - 06/05/24 1427       Edinburgh Postnatal Depression Scale:  In the Past 7 Days   I have been able to laugh and see the funny side of things. 0    I have looked forward with enjoyment to things. 0    I have blamed myself unnecessarily when things went wrong. 0    I have been anxious or worried for no good reason. 0    I have felt scared or panicky for no good reason. 0    Things have been getting on top of me. 0    I have been so unhappy that I have had difficulty sleeping. 0    I have felt sad or miserable. 0    I have been so unhappy that I have been crying. 0    The thought of harming myself has occurred to me. 0    Edinburgh Postnatal Depression Scale Total 0          Health Maintenance Due  Topic Date Due   Meningococcal B Vaccine (1 of 2 - Standard) Never done   Pneumococcal Vaccine (1 of 2 - PCV)  Never done   Hepatitis B Vaccines 19-59 Average Risk (1 of 3 - 19+ 3-dose series) Never done   Influenza Vaccine  04/14/2024   COVID-19 Vaccine (5 - 2025-26 season) 05/15/2024    The following portions of the patient's history were reviewed and updated as appropriate: allergies, current medications, past family history, past medical history, past social history, past surgical history, and problem list.  Review of Systems Pertinent items noted in HPI and remainder of comprehensive ROS otherwise negative.  Objective:  BP 119/78   Pulse 78   Wt 201 lb (91.2 kg)   LMP 08/11/2023 (Exact Date)   Breastfeeding Yes   BMI 34.50 kg/m    General:  alert, cooperative, and appears stated age   Breasts:  normal  Lungs: clear to auscultation bilaterally  Heart:  regular rate and rhythm, S1, S2 normal, no murmur, click, rub or gallop  Abdomen: +Mild TTP in LUQ. Soft, bowel sounds normal, no masses or organomegaly  Wound Well-approximated without erythema, edema, discharge  GU exam:  normal       Assessment:  1. Postpartum exam (Primary) -Patient doing well  -Wound well approximated and healing well  -Patient politely declines contraception today -Cervicovaginal ancillary only( [facility])  2. Left upper quadrant abdominal pain LUQ pain x 1 week, worse after  meals and spicy foods. Mild TTP in LUQ on exam without fever, nausea, vomiting, or apparent distress. Will treat as GERD with referral to GI in case PPI not effective.  3. Constipation, unspecified constipation type Patient seen by GI for chronic constipation 08/2023. Discussed lifestyle modifications that may provide relief, several of which she has already implemented. Recommendations given in detail in AVS. Patient to make appointment with GI for next steps.   4. Preeclampsia in postpartum period Patient normotensive on labetalol  200 mg Will have her return in two weeks to re-take BP after medication course completed  Plan:    Essential components of care per ACOG recommendations:  1.  Mood and well being: Patient with negative depression screening today. Reviewed local resources for support.  - Patient tobacco use? No.   - hx of drug use? No.    2. Infant care and feeding:  -Patient currently breastmilk feeding? Yes. Reviewed importance of draining breast regularly to support lactation.  -Social determinants of health (SDOH) reviewed in EPIC. No concerns identified.   3. Sexuality, contraception and birth spacing - Patient does not want a pregnancy in the next year.   - Reviewed reproductive life planning. Reviewed contraceptive methods based on pt preferences and effectiveness.  Patient politely declines contraception today.   - Discussed birth spacing of 18 months  4. Sleep and fatigue -Encouraged family/partner/community support of 4 hrs of uninterrupted sleep to help with mood and fatigue  5. Physical Recovery  - Discussed patients delivery and complications. She describes her labor as good. - Patient had a Vaginal problems after delivery including postpartum preeclampsia. Patient had a 2nd degree laceration. Perineal healing reviewed. Patient expressed understanding - Patient has urinary incontinence? No. - Patient is safe to resume physical and sexual activity when she is ready.   6.  Health Maintenance - HM due items addressed Yes - Last pap smear  Diagnosis  Date Value Ref Range Status  01/18/2023   Final   - Negative for intraepithelial lesion or malignancy (NILM)   Pap smear not indicated at today's visit.  -Breast Cancer screening indicated? No.   7. Chronic Disease/Pregnancy Condition follow up: postpartum preeclampsia.   - PCP follow up  Jorene FORBES Moats, PA-C [facility] [employer], [facility]

## 2024-06-05 NOTE — Progress Notes (Unsigned)
 2nd degree tear during birth. Pt states weird tightness feeling when urinating.  Since birth of baby pt states she has had some bumps on outer labia resembles white-head pimples. No pain. No discharge. Very itchy. Odor present.   Pt still bleeding; pink to dark red, light flow, some clots. Pt reports pain (scale of 4 out of 10) in left abdomen for approx one week. Experiencing some constipation; using Miralax  PRN.   Not interested in any form of birth control at this time.

## 2024-06-05 NOTE — Patient Instructions (Addendum)
 You have constipation which is hard stools that are difficult to pass. It is important to have regular bowel movements every 1-3 days that are soft and easy to pass. Hard stools increase your risk of hemorrhoids and are very uncomfortable.   To prevent constipation you can increase the amount of fiber in your diet. Examples of foods with fiber are leafy greens, whole grain breads, oatmeal and other grains.  It is also important to drink at least eight 8oz glass of water everyday.   If you have not has a bowel movement in 4-5 days you made need to clean out your bowel.  This will have establish normal movement through your bowel.    Miralax Clean out  Take 8 capfuls of miralax in 64 oz of gatorade. You can use any fluid that appeals to you (gatorade, water, juice)  Continue to drink at least eight 8 oz glasses of water throughout the day  You can repeat with another 8 capfuls of miralax in 64 oz of gatorade if you are not having a large amount of stools  You will need to be at home and close to a bathroom for about 8 hours when you do the above as you may need to go to the bathroom frequently.   After you are cleaned out: - Start Colace100mg  twice daily - Start Miralax once daily - Start a daily fiber supplement like metamucil or citrucel - You can safely use enemas in pregnancy  - if you are having diarrhea you can reduce to Colace once a day or miralax every other day or a 1/2 capful daily.

## 2024-06-06 LAB — CERVICOVAGINAL ANCILLARY ONLY
Bacterial Vaginitis (gardnerella): NEGATIVE
Candida Glabrata: NEGATIVE
Candida Vaginitis: NEGATIVE
Chlamydia: NEGATIVE
Comment: NEGATIVE
Comment: NEGATIVE
Comment: NEGATIVE
Comment: NEGATIVE
Comment: NEGATIVE
Comment: NORMAL
Neisseria Gonorrhea: NEGATIVE
Trichomonas: NEGATIVE

## 2024-06-06 MED ORDER — OMEPRAZOLE MAGNESIUM 20 MG PO TBEC: 20.0000 mg | DELAYED_RELEASE_TABLET | Freq: Every day | ORAL | 2 refills | Status: AC

## 2024-06-12 ENCOUNTER — Ambulatory Visit (HOSPITAL_COMMUNITY): Payer: Self-pay | Admitting: Physician Assistant

## 2024-06-16 ENCOUNTER — Encounter: Payer: Self-pay | Admitting: Family Medicine

## 2024-06-29 ENCOUNTER — Inpatient Hospital Stay (EMERGENCY_DEPARTMENT_HOSPITAL)
Admission: AD | Admit: 2024-06-29 | Discharge: 2024-06-29 | Disposition: A | Discharge status: Home / Self Care | Payer: MEDICAID | Source: Home / Self Care | Attending: Obstetrics and Gynecology | Admitting: Obstetrics and Gynecology

## 2024-06-29 ENCOUNTER — Other Ambulatory Visit: Payer: Self-pay

## 2024-06-29 ENCOUNTER — Emergency Department (HOSPITAL_BASED_OUTPATIENT_CLINIC_OR_DEPARTMENT_OTHER)
Admission: EM | Admit: 2024-06-29 | Discharge: 2024-06-29 | Discharge status: Against Medical Advice | Payer: MEDICAID | Attending: Obstetrics and Gynecology | Admitting: Obstetrics and Gynecology

## 2024-06-29 DIAGNOSIS — Z5321 Procedure and treatment not carried out due to patient leaving prior to being seen by health care provider: Secondary | ICD-10-CM | POA: Insufficient documentation

## 2024-06-29 DIAGNOSIS — R1084 Generalized abdominal pain: Secondary | ICD-10-CM | POA: Insufficient documentation

## 2024-06-29 DIAGNOSIS — R109 Unspecified abdominal pain: Secondary | ICD-10-CM | POA: Diagnosis not present

## 2024-06-29 DIAGNOSIS — O9089 Other complications of the puerperium, not elsewhere classified: Secondary | ICD-10-CM | POA: Diagnosis not present

## 2024-06-29 LAB — CBC
HCT: 39.3 % (ref 36.0–46.0)
Hemoglobin: 12.7 g/dL (ref 12.0–15.0)
MCH: 30.4 pg (ref 26.0–34.0)
MCHC: 32.3 g/dL (ref 30.0–36.0)
MCV: 94 fL (ref 80.0–100.0)
Platelets: 320 K/uL (ref 150–400)
RBC: 4.18 MIL/uL (ref 3.87–5.11)
RDW: 14.6 % (ref 11.5–15.5)
WBC: 3.8 K/uL — ABNORMAL LOW (ref 4.0–10.5)
nRBC: 0 % (ref 0.0–0.2)

## 2024-06-29 LAB — COMPREHENSIVE METABOLIC PANEL WITH GFR
ALT: 13 U/L (ref 0–44)
AST: 16 U/L (ref 15–41)
Albumin: 4.5 g/dL (ref 3.5–5.0)
Alkaline Phosphatase: 86 U/L (ref 38–126)
Anion gap: 11 (ref 5–15)
BUN: 13 mg/dL (ref 6–20)
CO2: 26 mmol/L (ref 22–32)
Calcium: 9.8 mg/dL (ref 8.9–10.3)
Chloride: 106 mmol/L (ref 98–111)
Creatinine, Ser: 0.93 mg/dL (ref 0.44–1.00)
GFR, Estimated: >60 mL/min (ref >60)
Glucose, Bld: 91 mg/dL (ref 70–99)
Potassium: 4.1 mmol/L (ref 3.5–5.1)
Sodium: 143 mmol/L (ref 135–145)
Total Bilirubin: 0.3 mg/dL (ref 0.0–1.2)
Total Protein: 7.9 g/dL (ref 6.5–8.1)

## 2024-06-29 LAB — URINALYSIS, ROUTINE W REFLEX MICROSCOPIC
Bilirubin Urine: NEGATIVE
Glucose, UA: NEGATIVE mg/dL
Ketones, ur: NEGATIVE mg/dL
Nitrite: NEGATIVE
Protein, ur: NEGATIVE mg/dL
RBC / HPF: >50 RBC/hpf (ref 0–5)
Specific Gravity, Urine: 1.017 (ref 1.005–1.030)
pH: 6 (ref 5.0–8.0)

## 2024-06-29 LAB — PREGNANCY, URINE: Preg Test, Ur: NEGATIVE

## 2024-06-29 LAB — LIPASE, BLOOD: Lipase: 37 U/L (ref 11–51)

## 2024-06-29 NOTE — MAU Provider Note (Signed)
  [name] is a 23 y.o. G1P1001 patient who presents to MAU today with complaint of 8 weeks postpartum and has abdominal pain. She reports the pain is cramping and occurs when she swallows food.    O BP 126/87   Pulse 79   Temp 98.5 F (36.9 C)   Resp 17   Ht 5' 5 (1.651 m)   Wt 90.6 kg   SpO2 99%   BMI 33.23 kg/m  Physical Exam Vitals reviewed.  Constitutional:      General: She is not in acute distress.    Appearance: Normal appearance. She is well-developed. She is not ill-appearing, toxic-appearing or diaphoretic.  HENT:     Head: Normocephalic.  Cardiovascular:     Pulses: Normal pulses.  Pulmonary:     Effort: Pulmonary effort is normal. No respiratory distress.  Skin:    General: Skin is warm and dry.     Capillary Refill: Capillary refill takes less than 2 seconds.  Neurological:     General: No focal deficit present.     Mental Status: She is alert and oriented to person, place, and time.  Psychiatric:        Mood and Affect: Mood normal.        Behavior: Behavior normal.     A Medical screening exam complete Abdominal pain, unspecified abdominal location  P Discharge from MAU in stable condition Patient given the option of transfer to [facility] for further evaluation or seek care in outpatient facility of choice  List of options for follow-up given  Warning signs for worsening condition that would warrant emergency follow-up discussed Patient may return to MAU as needed   Regino Camie LABOR, CNM 06/29/2024 8:59 PM

## 2024-06-29 NOTE — Progress Notes (Signed)
 [name] CNM in to see pt and discuss plan of care. Written and verbal d/c instructions given and pt voiced understanding.

## 2024-06-29 NOTE — ED Triage Notes (Signed)
 Patient reports generalized abdominal pain for the past month. Recent vaginal delivery 2 months ago. Denies n/v/d.

## 2024-06-29 NOTE — MAU Note (Addendum)
 Deanna Mckay is a 23 y.o. at Unknown here in MAU reporting having abd pain since having her baby on 05/02/24. She had svd. Reports cramping when she swallows food in her upper abdomen. Some pain L side of stomach that is cramping. States she is currently bleeding as her period started 10/11. Formula feeding  LMP: 06/24/24 Onset of complaint: 05/02/24 Pain score: 6 Vitals:   06/29/24 1943 06/29/24 1946  BP:  126/87  Pulse: 79   Resp: 17   Temp: 98.5 F (36.9 C)   SpO2: 99%      FHT: na  Lab orders placed from triage: none

## 2024-07-18 ENCOUNTER — Encounter: Payer: MEDICAID | Admitting: Family Medicine

## 2024-07-21 ENCOUNTER — Ambulatory Visit (INDEPENDENT_AMBULATORY_CARE_PROVIDER_SITE_OTHER): Payer: MEDICAID | Admitting: Family Medicine

## 2024-07-21 ENCOUNTER — Encounter: Payer: Self-pay | Admitting: Family Medicine

## 2024-07-21 VITALS — BP 125/90 | HR 85 | Temp 98.4°F | Wt 205.2 lb

## 2024-07-21 DIAGNOSIS — Z0000 Encounter for general adult medical examination without abnormal findings: Secondary | ICD-10-CM | POA: Diagnosis not present

## 2024-07-21 DIAGNOSIS — K5909 Other constipation: Secondary | ICD-10-CM

## 2024-07-21 DIAGNOSIS — R030 Elevated blood-pressure reading, without diagnosis of hypertension: Secondary | ICD-10-CM

## 2024-07-21 DIAGNOSIS — E781 Pure hyperglyceridemia: Secondary | ICD-10-CM | POA: Diagnosis not present

## 2024-07-21 MED ORDER — POLYETHYLENE GLYCOL 3350 17 GM/SCOOP PO POWD: 17.0000 g | ORAL | 0 refills | Status: AC | PRN

## 2024-07-21 NOTE — Progress Notes (Signed)
 SUBJECTIVE:   Chief compliant/HPI: annual examination  NASRIN LANZO is a 23 y.o. who presents today for an annual exam.   Review of systems form notable for abdominal pain for a couple weeks and some sinus issues for a couple days. Recently traveled to [place] . Taking Benadryl  and Claritin  to help. Eating more fiber to help with the abdominal pain and endorses having a cramping sensation with certain foods usually prior to her menstrual cycle. Endorses some constipation and occasional regular stools daily. Bristol stool between 1 and 4. Has tried some Miralax , but hasn't noticed many changes.  Updated history tabs and problem list.   OBJECTIVE:   BP (!) 125/90   Pulse 85   Temp 98.4 F (36.9 C)   Wt 205 lb 3.2 oz (93.1 kg)   LMP 06/24/2024   SpO2 100%   BMI 34.15 kg/m   General: Awake and Alert in NAD HEENT: NCAT. Sclera anicteric. No rhinorrhea. Cardiovascular: RRR. No M/R/G Respiratory: CTAB, normal WOB on RA. No wheezing, crackles, rhonchi, or diminished breath sounds. Abdomen: Soft, TTP to over epigastric region and lower abdomen, non-distended. Bowel sounds normoactive Extremities: Able to move all extremities. No BLE edema, no deformities or significant joint findings. Skin: Warm and dry. No abrasions or rashes noted. Neuro: A&Ox3. No focal neurological deficits.  ASSESSMENT/PLAN:   Assessment & Plan Annual physical exam PHQ score, reviewed and discussed.  Flowsheet Row Office Visit from 07/21/2024 in [facility]  PHQ-9 Total Score 4   Asked about intimate partner violence and patient reports none.  The patient currently not using anything for contraception, currently using condoms. Folate recommended as appropriate, minimum of 400 mcg per day.   Considered the following items based upon USPSTF recommendations: HIV testing:recently completed and result reviewed, normal  Hepatitis C: recently completed  and result reviewed, normal  Hepatitis B:recently completed and result reviewed, normal  Syphilis if at high risk: discussed and declined GC/CT not at high risk and not ordered. Lipid panel (nonfasting or fasting) discussed based upon AHA recommendations and ordered.  Consider repeat every 4-6 years.   Discussed family history, BRCA testing not indicated.  Cervical cancer screening: prior Pap reviewed, repeat due in 01/2026 Immunizations: Schedule nurse visit for Flu, COVID and MenB vaccine 11/14 MyChart Activation:Already signed up  Follow up in 1 year or sooner if indicated.  Chronic constipation Occasionally uses MiraLAX  and has increased fiber intake, but continues to have constipation.  Mild TTP over epigastric and lower abdominal region that she describes as cramping, menstrual cycle coming soon as well.  Denies any family or personal history of IBS or Crohn's. - Advised regular use of MiraLAX  daily, prescription refilled - Advised increasing her fiber and water intake - If symptoms persist could consider adding senna or pursue further workup Hypertriglyceridemia Triglycerides elevated to 171, 10 years ago. - Lipid panel ordered Elevated BP without diagnosis of hypertension Blood pressure reviewed and mildly elevated.  Patient has a history of HTN of pregnancy, was on labetalol  s/p delivery 200 mg twice daily, but is no longer taking this as she ran out. - Advised patient to bring in her BP cuff and a log of her pressures next week to her nurse visit for review and to compare her BP cuff for accuracy - If persistently elevated, will refer to Dr. Koval for ambulatory BP monitoring and possibly consider testing for secondary causes of HTN  Veda Arrellano  Janna, DO [facility]

## 2024-07-21 NOTE — Patient Instructions (Addendum)
 It was great to see you today! Thank you for choosing [facility] for your primary care. Deanna Mckay was seen for annual.  Today we addressed: Annual - return for vaccines next Friday at 3:30 Constipation - regularly use Miralax , increase fiber and water intake. If symptoms continue to persist or bowel movements aren't regular, please return to discuss further and could consider Senna additionally  We are checking some labs today. I will send you a MyChart message with your results, per your preference. If you do not hear about your labs in the next 2 weeks, please call the office.  You should return to our clinic Return in about 1 week (around 07/28/2024). Please arrive 15 minutes before your appointment to ensure smooth check in process.  We appreciate your efforts in making this happen.  Thank you for allowing me to participate in your care, Kathrine Melena, DO 07/21/2024, 4:20 PM PGY-2, [facility]

## 2024-07-22 ENCOUNTER — Ambulatory Visit: Payer: Self-pay | Admitting: Family Medicine

## 2024-07-22 LAB — LIPID PANEL
Chol/HDL Ratio: 3 ratio (ref 0.0–4.4)
Cholesterol, Total: 185 mg/dL (ref 100–199)
HDL: 61 mg/dL (ref >39)
LDL Chol Calc (NIH): 105 mg/dL — ABNORMAL HIGH (ref 0–99)
Triglycerides: 107 mg/dL (ref 0–149)
VLDL Cholesterol Cal: 19 mg/dL (ref 5–40)

## 2024-07-28 ENCOUNTER — Encounter: Payer: Self-pay | Admitting: Family Medicine

## 2024-07-28 ENCOUNTER — Ambulatory Visit (INDEPENDENT_AMBULATORY_CARE_PROVIDER_SITE_OTHER): Payer: MEDICAID

## 2024-07-28 DIAGNOSIS — Z0130 Encounter for examination of blood pressure without abnormal findings: Secondary | ICD-10-CM

## 2024-07-28 DIAGNOSIS — Z23 Encounter for immunization: Secondary | ICD-10-CM

## 2024-07-28 NOTE — Progress Notes (Signed)
 Patient presents to nurse clinic to update vaccinations. Patient elects to receive only COVID vaccine today.   Administered COVID vaccine in LD, site unremarkable, tolerated injection well.   Patient also reports that Dr. Janna asked her to bring in her home BP cuff and compare to our reading in the office.   Measured patient's BP in right arm with home cuff. BP: 139/95.  Measured manually in right arm with reading of 130/82.  Patient reports having BP machine for about 1 year. She has not changed batteries recently. Recommended that patient change batteries and continue BP log.   Patient brought home BP readings.   Yesterday: 9342- 866/07 2029- 141/95 2038- 133/100 2044- 120/78 2046- 129/90  Today: 1055: 123/86  Patient is asymptomatic.   Advised patient that I would reach out to provider regarding BP follow up and if referral should be placed for Dr. Koval for 24 hour monitoring.   Chiquita JAYSON English, RN

## 2024-07-28 NOTE — Progress Notes (Signed)
 It appears her home BP cuff is not as accurate. I do think seeing Dr. Koval for ambulatory BP monitoring would be helpful.  Please inform patient  Thank you! Laymon JINNY Legions, MD

## 2024-07-31 NOTE — Addendum Note (Signed)
 Addended by: Dametra Whetsel C on: 07/31/2024 02:24 PM   Modules accepted: Orders

## 2024-07-31 NOTE — Progress Notes (Signed)
 Placed referral for ambulatory BP monitoring per Dr. Koval. Attempted to reach patient to schedule.   She did not answer, LVM and will send mychart message as well.   Chiquita JAYSON English, RN

## 2024-08-08 ENCOUNTER — Encounter: Payer: Self-pay | Admitting: Pharmacist

## 2024-08-08 ENCOUNTER — Ambulatory Visit (INDEPENDENT_AMBULATORY_CARE_PROVIDER_SITE_OTHER): Payer: MEDICAID | Admitting: Pharmacist

## 2024-08-08 VITALS — BP 131/83 | HR 72 | Wt 206.0 lb

## 2024-08-08 DIAGNOSIS — I10 Essential (primary) hypertension: Secondary | ICD-10-CM

## 2024-08-08 DIAGNOSIS — O139 Gestational [pregnancy-induced] hypertension without significant proteinuria, unspecified trimester: Secondary | ICD-10-CM

## 2024-08-08 NOTE — Assessment & Plan Note (Signed)
 History of gestational hypertension earlier in 2025 with goal presssure of <130/80 mm Hg.     -Placed blood pressure cuff, provided education, patient instructed to wear cuff for 24 hours and return tomorrow. Will call Monday afternoon 08/14/24 to review results.   Written patient instructions provided including activity/symptom/event log. Patient verbalized understanding of plan.

## 2024-08-08 NOTE — Patient Instructions (Signed)
 Blood Pressure Activity Diary Time Lying down/ Sleeping Walking/ Exercise Stressed/ Angry Headache/ Pain Dizzy  9 AM       10 AM       11 AM       12 PM       1 PM       2 PM       Time Lying down/ Sleeping Walking/ Exercise Stressed/ Angry Headache/ Pain Dizzy  3 PM       4 PM        5 PM       6 PM       7 PM       8 PM       Time Lying down/ Sleeping Walking/ Exercise Stressed/ Angry Headache/ Pain Dizzy  9 PM       10 PM       11 PM       12 AM       1 AM       2 AM       3 AM       Time Lying down/ Sleeping Walking/ Exercise Stressed/ Angry Headache/ Pain Dizzy  4 AM       5 AM       6 AM       7 AM       8 AM       9 AM       10 AM        Time you woke up: _________                  Time you went to sleep:__________  Come back tomorrow afternoon return monitor  Call the [facility] if you have any questions before then [phone])  Wearing the Blood Pressure Monitor The cuff will inflate every 20 minutes during the day and every 30 minutes while you sleep. Fill out the blood pressure-activity diary during the day, especially during activities that may affect your reading -- such as exercise, stress, walking, taking your blood pressure medications  Important things to know: Avoid taking the monitor off for the next 24 hours, unless it causes you discomfort or pain. Do NOT get the monitor wet and do NOT try to clean the monitor with any cleaning products. Do NOT put the monitor on anyone else's arm. When the cuff inflates, avoid excess movement. Let the cuffed arm hang loosely, slightly away from the body. Avoid flexing the muscles or moving the hand/fingers. Remember to fill out the blood pressure activity diary. If you experience severe pain or unusual pain (not associated with getting your blood pressure checked), remove the monitor.  Troubleshooting:  Code  Troubleshooting   1  Check cuff position, tighten cuff   2, 3  Remain still during  reading   4, 87  Check air hose connections and make sure cuff is tight   85, 89  Check hose connections and make tubing is not crimped   86  Push START/STOP to restart reading   88, 91  Retry by pushing START/STOP   90  Replace batteries. If problem persists, remove monitor and bring back to   clinic at follow up   97, 98, 99  Service required - Remove monitor and bring back to clinic at follow up

## 2024-08-08 NOTE — Progress Notes (Signed)
   S:     Chief Complaint  Patient presents with   Medication Management    Amb Blood Pressure Day 1   23 y.o. female who presents for hypertension evaluation, education, and management. Patient arrives in  good spirits and presents without  any assistance.  Patient was referred and last seen by Primary Care Provider, Dr. Janna, on 07/21/2024.  At last visit, patient was referred for Ambulatory Blood Pressure monitoring.   PMH is significant for gestational hypertension, asthma.   Medication compliance is reported to be good.  Discussed procedure for wearing the monitor and gave patient written instructions. Monitor was placed on non-dominant arm with instructions to return in the morning.   Current BP Medications include:  none  O:  Review of Systems  All other systems reviewed and are negative.   Physical Exam Vitals reviewed.  Constitutional:      Appearance: Normal appearance.  Pulmonary:     Effort: Pulmonary effort is normal.  Neurological:     Mental Status: She is alert.  Psychiatric:        Mood and Affect: Mood normal.        Behavior: Behavior normal.        Thought Content: Thought content normal.        Judgment: Judgment normal.    Last 3 Office BP readings: BP Readings from Last 3 Encounters:  08/08/24 131/83  07/21/24 (!) 125/90  06/29/24 126/87   Basic Metabolic Panel    Component Value Date/Time   NA 143 06/29/2024 1742   NA 141 05/09/2024 1355   K 4.1 06/29/2024 1742   CL 106 06/29/2024 1742   CO2 26 06/29/2024 1742   GLUCOSE 91 06/29/2024 1742   BUN 13 06/29/2024 1742   BUN 10 05/09/2024 1355   CREATININE 0.93 06/29/2024 1742   CREATININE 0.74 12/02/2017 1049   CALCIUM  9.8 06/29/2024 1742   GFRNONAA >60 06/29/2024 1742   GFRAA 107 10/03/2020 0918   ABPM Study Data: Arm Placement left arm  For Office Goal BP of <130/80 mmHg:  ABPM thresholds: Overall BP <125/75 mmHg, daytime BP <130/80 mmHg, sleeptime BP <110/65 mmHg    A/P: History of gestational hypertension earlier in 2025 with goal presssure of <130/80 mm Hg.     -Placed blood pressure cuff, provided education, patient instructed to wear cuff for 24 hours and return tomorrow. Will call Monday afternoon 08/14/24 to review results.   Written patient instructions provided including activity/symptom/event log. Patient verbalized understanding of plan. Total time in face to face counseling 15 minutes.    Follow-up: Tomorrow afternoon  Patient seen with Lawson Mao, PharmD Candidate - PY3 student.

## 2024-08-09 NOTE — Progress Notes (Signed)
 Reviewed and agree with Dr Rennis plan.

## 2024-08-14 ENCOUNTER — Telehealth: Payer: Self-pay | Admitting: Pharmacist

## 2024-08-14 DIAGNOSIS — Z8759 Personal history of other complications of pregnancy, childbirth and the puerperium: Secondary | ICD-10-CM

## 2024-08-14 NOTE — Telephone Encounter (Signed)
 Patient contacted for follow-up of Amb Blood Pressure  results from test conducted 11/25-11/26 prior to Thanksgiving Holiday. Patient returned 24 hour blood pressure monitor 11/26 and reported they were able to wear the ambulatory blood pressure cuff for the entire 24 evaluation period.  As I was out of the office until 12/1 - follow-up of results was conducted by phone.   Current Blood Pressure Medications: None  Last 3 Office BP readings: BP Readings from Last 3 Encounters:  08/08/24 131/83  07/21/24 (!) 125/90  06/29/24 126/87    Basic Metabolic Panel    Component Value Date/Time   NA 143 06/29/2024 1742   NA 141 05/09/2024 1355   K 4.1 06/29/2024 1742   CL 106 06/29/2024 1742   CO2 26 06/29/2024 1742   GLUCOSE 91 06/29/2024 1742   BUN 13 06/29/2024 1742   BUN 10 05/09/2024 1355   CREATININE 0.93 06/29/2024 1742   CREATININE 0.74 12/02/2017 1049   CALCIUM  9.8 06/29/2024 1742   GFRNONAA >60 06/29/2024 1742   GFRAA 107 10/03/2020 0918    ABPM Study Data:  Overall Mean 24hr BP:   125/72 mmHg  HR: 72  Daytime Mean BP:  130/76 mmHg  HR: 75  Nighttime Mean BP:  117/64 mmHg  HR: 66  Dipping Pattern: Yes.    Sys:   10%   Dia: 15%  [normal dipping ~10-20%]    For Office Goal BP of <130/80 mmHg:  ABPM thresholds: Overall BP <125/75 mmHg, daytime BP <130/80 mmHg, sleeptime BP <110/65 mmHg  Patient is participating in a Managed Medicaid Plan:  Yes   A/P: History of elevated in-office blood pressures and gestational hypertension.  No longer taking any blood pressure lowering medication at the time of this evaluation.  Goal pressure of <130/80 mm Hg. Found to have normal blood pressure with 24-hour ambulatory blood pressure evaluation which demonstrates an average AWAKE blood pressure of 130/76 mmHg.  Nocturnal dipping pattern is normal.   - No medication therapy suggested at this time.  Discussed her currently being at adult goal readings.   Results reviewed and patient verbalized  understanding of treatment plan.   Follow-up: PRN  Pharmacist None planned at this time.  PCP clinic visit in PRN

## 2024-08-14 NOTE — Assessment & Plan Note (Signed)
 History of elevated in-office blood pressures and gestational hypertension.  No longer taking any blood pressure lowering medication at the time of this evaluation.  Goal pressure of <130/80 mm Hg. Found to have normal blood pressure with 24-hour ambulatory blood pressure evaluation which demonstrates an average AWAKE blood pressure of 130/76 mmHg.  Nocturnal dipping pattern is normal.   - No medication therapy suggested at this time.  Discussed her currently being at adult goal readings.

## 2024-08-15 NOTE — Telephone Encounter (Signed)
 Reviewed and agree with Dr Rennis plan.

## 2024-08-29 ENCOUNTER — Ambulatory Visit: Payer: MEDICAID | Admitting: Neurology

## 2024-08-29 ENCOUNTER — Encounter: Payer: Self-pay | Admitting: Neurology

## 2024-08-29 ENCOUNTER — Other Ambulatory Visit: Payer: Self-pay | Admitting: Neurology

## 2024-08-29 VITALS — BP 103/57 | HR 72 | Ht 65.0 in | Wt 203.8 lb

## 2024-08-29 DIAGNOSIS — R519 Headache, unspecified: Secondary | ICD-10-CM

## 2024-08-29 DIAGNOSIS — R351 Nocturia: Secondary | ICD-10-CM | POA: Diagnosis not present

## 2024-08-29 DIAGNOSIS — G43909 Migraine, unspecified, not intractable, without status migrainosus: Secondary | ICD-10-CM

## 2024-08-29 DIAGNOSIS — E66811 Obesity, class 1: Secondary | ICD-10-CM

## 2024-08-29 DIAGNOSIS — Z820 Family history of epilepsy and other diseases of the nervous system: Secondary | ICD-10-CM | POA: Diagnosis not present

## 2024-08-29 DIAGNOSIS — Z9189 Other specified personal risk factors, not elsewhere classified: Secondary | ICD-10-CM

## 2024-08-29 DIAGNOSIS — F439 Reaction to severe stress, unspecified: Secondary | ICD-10-CM

## 2024-08-29 DIAGNOSIS — G479 Sleep disorder, unspecified: Secondary | ICD-10-CM | POA: Diagnosis not present

## 2024-08-29 MED ORDER — RIZATRIPTAN BENZOATE 5 MG PO TABS: 5.0000 mg | ORAL_TABLET | ORAL | 0 refills | Status: DC | PRN

## 2024-08-29 NOTE — Progress Notes (Signed)
 Subjective:    Patient ID: Deanna Mckay is a 23 y.o. female.  HPI    True Mar, MD, PhD [facility] [address], [address] [address] [place], [place] [address]  Dear Deanna Mckay,   I saw your patient, Deanna Mckay, upon your kind request in my neurologic clinic today for initial consultation of her recurrent headaches, concern for migraines.  The patient is unaccompanied today. As you know, Deanna Mckay is a 23 year old female with an underlying medical history of acne, anxiety, asthma, depression, eczema, goiter, obesity, recent status post childbirth in September 2025, who reports recurrent headaches for the past several months.  She feels that she had more consistent headaches on the left side in the last month of her pregnancy, improved after having the baby.  She is currently not breast-feeding.  She has not tried any prescription medicines for her headaches.  She currently takes Tylenol  as needed.  She has about 2 headaches per month and they do not last as long as they used to.  She has occasional foggy headedness.  She has a headache associated with nausea at times.  She has had no recent vomiting.  No obvious family history of migraines.  She lives with her boyfriend and 39-month-old infant son.  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  She does not work from home.  She does not drink caffeine  daily.  She does not drink any alcohol.  She is a non-smoker.  She denies any sudden onset one-sided weakness or numbness or tingling or droopy face or slurring of speech.  She has not addressed recurrent headaches with her PCP yet.  She is up-to-date with her eye examination, got new eyeglasses about 2 months ago.  She does endorse some stress, she endorses not sleeping well, sleep is obviously interrupted because of her small child.  She does snore according to her boyfriend.  She has never had a sleep study but would be willing to get a home sleep test done.  Her Epworth  sleepiness score is 9 out of 24, fatigue severity score is 29 out of 63.  She has a family history of sleep apnea in her maternal grandmother.  She tries to hydrate well with water, she estimates that she drinks about 4 to 516.9 ounce bottles of water per day.  She had a remote head CT without contrast on 05/12/2007 with indication of headache.  I reviewed the results: Impression: Negative noncontrasted head CT-if clinical symptoms persist or progress consider head MRI as clinically indicated.   Her Past Medical History Is Significant For: Past Medical History:  Diagnosis Date   Acanthosis nigricans, acquired    Acne 06/14/2015   Anxiety    Asthma    prn inhaler   Bipolar and related disorder (HCC) 12/17/2015   BRBPR (bright red blood per rectum) 07/19/2019   Chronic constipation 03/24/2012   Cyst of ovary, left    Decreased visual acuity 02/27/2015   Depression    Dyspareunia, female 07/10/2022   Dyspepsia    no current med.   Dysphagia 04/21/2012   Dyspnea on exertion 01/18/2023   Patient recently started exercising and reports getting SOB with exertion. She thinks this is likely related to her weight/deconditioning. Hx of asthma but she does not think albuterol  helps her symptoms.     Eczema    Goiter 12/14/2011   Herpes 2018   Insomnia 03/04/2016   Obesity    Oppositional defiant disorder  Post-operative nausea and vomiting    PTSD (post-traumatic stress disorder) 02/02/2015   Seasonal allergies    Soy allergy 04/29/2012   Vaginismus 06/05/2022    Her Past Surgical History Is Significant For: Past Surgical History:  Procedure Laterality Date   CLOSED REDUCTION AND PERCUTANEOUS PINNING OF HUMERUS FRACTURE Right 10/31/2005   supracondylar humerus fx.   CYST EXCISION Right 07/11/2002   temple area   MINOR SUPPRELIN  REMOVAL Left 01/11/2014   Procedure: REMOVAL OF SUPPRELIN  IMPLANT IN LEFT UPPER EXTREMITY;  Surgeon: CHRISTELLA. Julietta Millman, MD;  Location: [facility];  Service: Pediatrics;  Laterality: Left;   MOUTH SURGERY     SUPPRELIN  IMPLANT  01/14/2012   Procedure: SUPPRELIN  IMPLANT;  Surgeon: CHRISTELLA. Julietta Millman, MD;  Location: [facility];  Service: Pediatrics;  Laterality: Left;   TOENAIL EXCISION Right 03/19/2008   great toe    Her Family History Is Significant For: Family History  Problem Relation Age of Onset   Stroke Mother    Asthma Mother    Depression Mother    Hypertension Father    Heart disease Father    Asthma Father    Eczema Father    Cancer Maternal Aunt    Cancer Paternal Grandfather    Migraines Neg Hx     Her Social History Is Significant For: Social History   Socioeconomic History   Marital status: Single    Spouse name: Not on file   Number of children: 0   Years of education: Not on file   Highest education level: Not on file  Occupational History   Occupation: minor    Employer: [employer]    Comment: 4th grade at [employer]  Tobacco Use   Smoking status: Never   Smokeless tobacco: Never  Vaping Use   Vaping status: Never Used  Substance and Sexual Activity   Alcohol use: Not Currently    Comment: rarely will do wine   Drug use: Not Currently    Types: Marijuana   Sexual activity: Yes    Birth control/protection: None  Other Topics Concern   Not on file  Social History Narrative   Pt lived at home with mother.   Pt works    Social Drivers of Health   Tobacco Use: Low Risk (08/29/2024)   Patient History    Smoking Tobacco Use: Never    Smokeless Tobacco Use: Never    Passive Exposure: Not on file  Financial Resource Strain: Not on file  Food Insecurity: No Food Insecurity (04/30/2024)   Epic    Worried About Programme Researcher, Broadcasting/film/video in the Last Year: Never true    Ran Out of Food in the Last Year: Never true  Transportation Needs: No Transportation Needs (04/30/2024)   Epic    Lack of Transportation (Medical): No    Lack of Transportation (Non-Medical): No  Physical  Activity: Not on file  Stress: Not on file  Social Connections: Unknown (01/27/2022)   Received [facility]   Social Network    Social Network: Not on file  Depression (PHQ2-9): Low Risk (07/21/2024)   Depression (PHQ2-9)    PHQ-2 Score: 4  Alcohol Screen: Not on file  Housing: Unknown (04/30/2024)   Epic    Unable to Pay for Housing in the Last Year: No    Number of Times Moved in the Last Year: Not on file    Homeless in the Last Year: No  Utilities: Not At Risk (04/30/2024)  Epic    Threatened with loss of utilities: No  Health Literacy: Not on file    Her Allergies Are:  Allergies[1]:   Her Current Medications Are:  Outpatient Encounter Medications as of 08/29/2024  Medication Sig   acetaminophen  (TYLENOL ) 325 MG tablet Take 2 tablets (650 mg total) by mouth every 4 (four) hours as needed (for pain scale < 4).   albuterol  (VENTOLIN  HFA) 108 (90 Base) MCG/ACT inhaler Inhale 2 puffs into the lungs every 6 (six) hours as needed for wheezing or shortness of breath.   Blood Pressure Monitoring (BLOOD PRESSURE KIT) DEVI 1 Device by Does not apply route once a week.   Cholecalciferol (VITAMIN D3) 50 MCG (2000 UT) capsule Take 1 capsule (2,000 Units total) by mouth daily.   EPINEPHrine  (EPIPEN  2-PAK) 0.3 mg/0.3 mL IJ SOAJ injection Inject 0.3 mLs (0.3 mg total) into the muscle as needed for anaphylaxis (if you use this, you must call 911 immediately).   hydrocortisone  cream 1 % Apply 1 application topically 2 (two) times daily as needed (eczema).   ibuprofen  (ADVIL ) 600 MG tablet Take 1 tablet (600 mg total) by mouth every 6 (six) hours as needed for fever, headache, mild pain (pain score 1-3), moderate pain (pain score 4-6) or cramping.   loratadine  (CLARITIN ) 10 MG tablet Take 1 tablet (10 mg total) by mouth daily.   Multiple Vitamin (MULTIVITAMIN ADULT PO) Take 1 tablet by mouth daily at 2 PM.   omeprazole  (PRILOSEC  OTC) 20 MG tablet Take 1 tablet (20 mg total) by mouth daily.    polyethylene glycol powder (GLYCOLAX /MIRALAX ) 17 GM/SCOOP powder Take 17 g by mouth as needed for moderate constipation, mild constipation or severe constipation.   sertraline (ZOLOFT) 25 MG tablet Take 25 mg by mouth daily.   valACYclovir  (VALTREX ) 500 MG tablet Take 500 mg by mouth daily.   [DISCONTINUED] metFORMIN  (GLUCOPHAGE ) 500 MG tablet Take 1 tablet (500 mg total) by mouth 2 (two) times daily with a meal.   No facility-administered encounter medications on file as of 08/29/2024.  :   Review of Systems:  Out of a complete 14 point review of systems, all are reviewed and negative with the exception of these symptoms as listed below:  Review of Systems  Objective:  Neurological Exam  Physical Exam Physical Examination:   Vitals:   08/29/24 1442  BP: (!) 103/57  Pulse: 72    General Examination: The patient is a very pleasant 23 y.o. female in no acute distress. She appears well-developed and well-nourished and well groomed.   HEENT: Normocephalic, atraumatic, pupils are equal, round and reactive to light, extraocular tracking is good without limitation to gaze excursion or nystagmus noted. No photophobia.  Corrective eye glasses in place.  Funduscopic exam benign.  Hearing is grossly intact to tuning fork.  Face is symmetric with normal facial animation and normal facial sensation to light touch, temperature and vibration sense. Speech is clear without dysarthria. There is no hypophonia. There is no lip, neck/head, jaw or voice tremor. Neck is supple with full range of passive and active motion. There are no carotid bruits on auscultation.  Airway/Oropharynx exam reveals: Mild mouth dryness, good dental hygiene, moderate airway crowding secondary to small airway entry, Mallampati class II, small tonsils.  Tongue protrudes centrally and palate elevates symmetrically.   Chest: Clear to auscultation without wheezing, rhonchi or crackles noted.  Heart: S1+S2+0, regular and  normal without murmurs, rubs or gallops noted.   Abdomen: Soft, non-tender and non-distended.  Extremities: There is no pitting edema in the distal lower extremities bilaterally.   Skin: Warm and dry without trophic changes noted.   Musculoskeletal: exam reveals no obvious joint deformities.   Neurologically:  Mental status: The patient is awake, alert and oriented in all 4 spheres. Her immediate and remote memory, attention, language skills and fund of knowledge are appropriate. There is no evidence of aphasia, agnosia, apraxia or anomia. Speech is clear with normal prosody and enunciation. Thought process is linear. Mood is normal and affect is normal.  Cranial nerves II - XII are as described above under HEENT exam.  Motor exam: Normal bulk, strength and tone is noted. There is no obvious action or resting tremor.  No drift or rebound, no postural or intention tremor. Fine motor skills and coordination: Intact finger taps, hand movements and rapid alternating patting with both upper extremities, normal foot taps bilaterally in the lower extremities.  Cerebellar testing: No dysmetria or intention tremor. There is no truncal or gait ataxia.  Normal finger-to-nose, normal heel-to-shin bilaterally. Reflexes 1+ throughout, toes are downgoing bilaterally. Sensory exam: intact to light touch, temperature and vibration sense in the upper and lower extremities.  Romberg negative. Gait, station and balance: She stands easily. No veering to one side is noted. No leaning to one side is noted. Posture is age-appropriate and stance is narrow based. Gait shows normal stride length and normal pace. No problems turning are noted.  Normal tandem walk.  Assessment and Plan:   In summary, Ailee Pates Goelz is a very pleasant 23 y.o.-year old female with an underlying medical history of acne, anxiety, asthma, depression, eczema, goiter, obesity, recent status post childbirth in September 2025, who presents for  evaluation of her recurrent headaches of several months duration, worse during the last trimester of pregnancy, improved postpartum.  She is currently not breast-feeding her 73-month-old child.  She does endorse stress which can be a contributor, sleep deprivation and poor sleep quality could also be contributors as well as sleep disordered breathing which is not excluded.  We talked about headache triggers and alleviating factors.  We talked about medical management with as needed use of medication at this time as her headache frequency is thankfully not high.  Neurologic exam is nonfocal and she is reassured.  She is up-to-date with her eye examination.    Below is a summary of my recommendations and our discussion points from today's visit, based on chart review, history and examination. They were given these instructions verbally during the visit in detail and also in writing in the MyChart after visit summary (AVS), which they can access electronically. <<  Please remember, common headache triggers are: sleep deprivation, dehydration, overheating, stress, hypoglycemia or skipping meals and blood sugar fluctuations, excessive pain medications or excessive alcohol use or caffeine  withdrawal. Some people have food triggers such as aged cheese, orange juice or chocolate, especially dark chocolate, or MSG (monosodium glutamate). Try to avoid these headache triggers as much possible. It may be helpful to keep a headache diary to figure out what makes your headaches worse or brings them on and what alleviates them. Some people report headache onset after exercise but studies have shown that regular exercise may actually prevent headaches from coming. If you have exercise-induced headaches, please make sure that you drink plenty of fluid before and after exercising and that you do not over do it and do not overheat. Continue to hydrate well with water, about 64 ounces per day on average.  Try to get enough sleep,  about 7 to 8 hours per night.  Your neurological exam is nonfocal.  Please stay up-to-date with your eye examination.   Avoid and limit caffeine  as you are.   For as needed use for an acute migraine, we will try you on Maxalt  5 mg strength: take 1 pill early on when you suspect a migraine attack come on. You may take another pill within 2 hours, no more than 2 pills in 24 hours. Most people who take triptans do not have any serious side-effects. However, they can cause drowsiness (remember to not drive or use heavy machinery when drowsy), nausea, dizziness, dry mouth. Less common side effects include strange sensations, such as tightness in your chest or throat, tingling, flushing, and feelings of heaviness or pressure in areas such as the face, limbs, and chest. These in the chest can mimic heart related pain (angina) and may cause alarm, but usually these sensations are not harmful or a sign of a heart attack. However, if you develop intense chest pain or sensations of discomfort, you should stop taking your medication and consult with me or your PCP or go to the nearest urgent care facility or ER or call 911.  I do not see a pressing reason to proceed with a brain MRI especially since you have a benign exam and your headaches actually improved.  We can consider an MRI down the road if need be. I will order a home sleep test to look for signs of obstructive sleep apnea (aka OSA). As explained, the long-term risks and ramifications of untreated moderate to severe obstructive sleep apnea may include (but are not limited to): increased risk for cardiovascular disease, including congestive heart failure, stroke, difficult to control hypertension, treatment resistant obesity, arrhythmias, especially irregular heartbeat commonly known as A. Fib. (atrial fibrillation); even type 2 diabetes has been linked to untreated OSA.  We will plan a follow up in about 6 months with one of the nurse practitioners in this  clinic. >>    Thank you very much for allowing me to participate in the care of this nice patient. If I can be of any further assistance to you please do not hesitate to call me at [phone].  Sincerely,   True Mar, MD, PhD     [1]  Allergies Allergen Reactions   Other Shortness Of Breath and Other (See Comments)    Any type of BEANS exacerbate the patient's asthma   Trichophyton Shortness Of Breath and Other (See Comments)    Wheezing  Wheezing   Midazolam  Hcl Nausea And Vomiting   Penicillins Hives and Rash    Has patient had a PCN reaction causing immediate rash, facial/tongue/throat swelling, SOB or lightheadedness with hypotension: Yes Has patient had a PCN reaction causing severe rash involving mucus membranes or skin necrosis: No Has patient had a PCN reaction that required hospitalization: No Has patient had a PCN reaction occurring within the last 10 years: No If all of the above answers are NO, then may proceed with Cephalosporin use. Do not use cephalosporins.    Soy Allergy (Obsolete) Other (See Comments)    WHEEZING/EXACERBATES ASTHMA    Versed  [Midazolam  Hcl] Nausea And Vomiting   Ranitidine  Hcl Rash   Zantac  [Ranitidine  Hcl] Rash

## 2024-08-29 NOTE — Patient Instructions (Signed)
 It was nice to meet you today.   As discussed, your headaches are likely due to a combination of factors.   Here is what we discussed today and my recommendations for you:   Please remember, common headache triggers are: sleep deprivation, dehydration, overheating, stress, hypoglycemia or skipping meals and blood sugar fluctuations, excessive pain medications or excessive alcohol use or caffeine  withdrawal. Some people have food triggers such as aged cheese, orange juice or chocolate, especially dark chocolate, or MSG (monosodium glutamate). Try to avoid these headache triggers as much possible. It may be helpful to keep a headache diary to figure out what makes your headaches worse or brings them on and what alleviates them. Some people report headache onset after exercise but studies have shown that regular exercise may actually prevent headaches from coming. If you have exercise-induced headaches, please make sure that you drink plenty of fluid before and after exercising and that you do not over do it and do not overheat. Continue to hydrate well with water, about 64 ounces per day on average.  Try to get enough sleep, about 7 to 8 hours per night.  Your neurological exam is nonfocal.  Please stay up-to-date with your eye examination.   Avoid and limit caffeine  as you are.   For as needed use for an acute migraine, we will try you on Maxalt  5 mg strength: take 1 pill early on when you suspect a migraine attack come on. You may take another pill within 2 hours, no more than 2 pills in 24 hours. Most people who take triptans do not have any serious side-effects. However, they can cause drowsiness (remember to not drive or use heavy machinery when drowsy), nausea, dizziness, dry mouth. Less common side effects include strange sensations, such as tightness in your chest or throat, tingling, flushing, and feelings of heaviness or pressure in areas such as the face, limbs, and chest. These in the chest can  mimic heart related pain (angina) and may cause alarm, but usually these sensations are not harmful or a sign of a heart attack. However, if you develop intense chest pain or sensations of discomfort, you should stop taking your medication and consult with me or your PCP or go to the nearest urgent care facility or ER or call 911.  I do not see a pressing reason to proceed with a brain MRI especially since you have a benign exam and your headaches actually improved.  We can consider an MRI down the road if need be. I will order a home sleep test to look for signs of obstructive sleep apnea (aka OSA). As explained, the long-term risks and ramifications of untreated moderate to severe obstructive sleep apnea may include (but are not limited to): increased risk for cardiovascular disease, including congestive heart failure, stroke, difficult to control hypertension, treatment resistant obesity, arrhythmias, especially irregular heartbeat commonly known as A. Fib. (atrial fibrillation); even type 2 diabetes has been linked to untreated OSA.  We will plan a follow up in about 6 months with one of the nurse practitioners in this clinic.

## 2024-08-30 ENCOUNTER — Telehealth: Payer: Self-pay

## 2024-08-30 ENCOUNTER — Other Ambulatory Visit (HOSPITAL_COMMUNITY): Payer: Self-pay

## 2024-08-30 NOTE — Telephone Encounter (Signed)
 Pharmacy Patient Advocate Encounter   Received notification from RX Request Messages that prior authorization for Rizatriptan  is required/requested.   Insurance verification completed.   The patient is insured through [place] MEDICA[employer].   Per test claim: The current 30 day co-pay is, $0.  No PA needed at this time. This test claim was processed through [facility]- copay amounts may vary at other pharmacies due to pharmacy/plan contracts, or as the patient moves through the different stages of their insurance plan.

## 2024-09-12 ENCOUNTER — Ambulatory Visit: Payer: MEDICAID | Admitting: Neurology

## 2024-09-12 DIAGNOSIS — E66811 Obesity, class 1: Secondary | ICD-10-CM

## 2024-09-12 DIAGNOSIS — G43909 Migraine, unspecified, not intractable, without status migrainosus: Secondary | ICD-10-CM

## 2024-09-12 DIAGNOSIS — R351 Nocturia: Secondary | ICD-10-CM

## 2024-09-12 DIAGNOSIS — Z9189 Other specified personal risk factors, not elsewhere classified: Secondary | ICD-10-CM

## 2024-09-12 DIAGNOSIS — G4733 Obstructive sleep apnea (adult) (pediatric): Secondary | ICD-10-CM | POA: Diagnosis not present

## 2024-09-12 DIAGNOSIS — Z820 Family history of epilepsy and other diseases of the nervous system: Secondary | ICD-10-CM

## 2024-09-12 DIAGNOSIS — R0683 Snoring: Secondary | ICD-10-CM

## 2024-09-12 DIAGNOSIS — R519 Headache, unspecified: Secondary | ICD-10-CM

## 2024-09-12 DIAGNOSIS — G479 Sleep disorder, unspecified: Secondary | ICD-10-CM

## 2024-09-12 DIAGNOSIS — F439 Reaction to severe stress, unspecified: Secondary | ICD-10-CM

## 2024-09-17 ENCOUNTER — Emergency Department (HOSPITAL_BASED_OUTPATIENT_CLINIC_OR_DEPARTMENT_OTHER): Payer: MEDICAID | Admitting: Radiology

## 2024-09-17 ENCOUNTER — Encounter (HOSPITAL_BASED_OUTPATIENT_CLINIC_OR_DEPARTMENT_OTHER): Payer: Self-pay

## 2024-09-17 ENCOUNTER — Other Ambulatory Visit: Payer: Self-pay

## 2024-09-17 ENCOUNTER — Emergency Department (HOSPITAL_BASED_OUTPATIENT_CLINIC_OR_DEPARTMENT_OTHER)
Admission: EM | Admit: 2024-09-17 | Discharge: 2024-09-17 | Disposition: A | Discharge status: Home / Self Care | Payer: MEDICAID | Attending: Emergency Medicine | Admitting: Emergency Medicine

## 2024-09-17 DIAGNOSIS — R059 Cough, unspecified: Secondary | ICD-10-CM | POA: Insufficient documentation

## 2024-09-17 DIAGNOSIS — R110 Nausea: Secondary | ICD-10-CM | POA: Diagnosis not present

## 2024-09-17 DIAGNOSIS — R519 Headache, unspecified: Secondary | ICD-10-CM | POA: Diagnosis not present

## 2024-09-17 DIAGNOSIS — R6889 Other general symptoms and signs: Secondary | ICD-10-CM

## 2024-09-17 MED ORDER — ONDANSETRON 4 MG PO TBDP: 4.0000 mg | ORAL_TABLET | Freq: Three times a day (TID) | ORAL | 0 refills | Status: AC | PRN

## 2024-09-17 MED ORDER — BENZONATATE 100 MG PO CAPS: 100.0000 mg | ORAL_CAPSULE | Freq: Three times a day (TID) | ORAL | 0 refills | Status: DC

## 2024-09-17 NOTE — ED Provider Notes (Signed)
 " [place] EMERGENCY DEPARTMENT AT [facility] Provider Note   CSN: [id number] Arrival date & time: 09/17/24  1858     Patient presents with: No chief complaint on file.   Deanna Mckay is a 24 y.o. female.   24 year old female presenting with flulike symptoms.  Patient notes recent respiratory illness that resolved approximately 2 weeks ago, however today began to experience body aches/headache/a dry cough/nausea.  No abdominal pain or diarrhea, no fever at home.  Reports that she has been managing her symptoms with OTC medications like DayQuil and Tylenol  with some relief.  Took at home COVID/flu test and results were negative.        Prior to Admission medications  Medication Sig Start Date End Date Taking? Authorizing Provider  acetaminophen  (TYLENOL ) 325 MG tablet Take 2 tablets (650 mg total) by mouth every 4 (four) hours as needed (for pain scale < 4). 05/04/24   Izell Harari, MD  albuterol  (VENTOLIN  HFA) 108 (90 Base) MCG/ACT inhaler Inhale 2 puffs into the lungs every 6 (six) hours as needed for wheezing or shortness of breath. 02/15/24   Leftwich-Kirby, Olam LABOR, CNM  Blood Pressure Monitoring (BLOOD PRESSURE KIT) DEVI 1 Device by Does not apply route once a week. 10/13/23   Eveline Lynwood MATSU, MD  Cholecalciferol (VITAMIN D3) 50 MCG (2000 UT) capsule Take 1 capsule (2,000 Units total) by mouth daily. 02/24/19   Lenon Marien CROME, MD  EPINEPHrine  (EPIPEN  2-PAK) 0.3 mg/0.3 mL IJ SOAJ injection Inject 0.3 mLs (0.3 mg total) into the muscle as needed for anaphylaxis (if you use this, you must call 911 immediately). 03/27/19   Carmelia Erma SAUNDERS, NP  hydrocortisone  cream 1 % Apply 1 application topically 2 (two) times daily as needed (eczema). 07/18/19   Delane Lye, MD  ibuprofen  (ADVIL ) 600 MG tablet Take 1 tablet (600 mg total) by mouth every 6 (six) hours as needed for fever, headache, mild pain (pain score 1-3), moderate pain (pain score 4-6) or cramping. 05/04/24    Izell Harari, MD  loratadine  (CLARITIN ) 10 MG tablet Take 1 tablet (10 mg total) by mouth daily. 02/11/24   Rudy Carlin LABOR, MD  Multiple Vitamin (MULTIVITAMIN ADULT PO) Take 1 tablet by mouth daily at 2 PM.    [provider]  omeprazole  (PRILOSEC  OTC) 20 MG tablet Take 1 tablet (20 mg total) by mouth daily. 06/06/24   Davis, Devon E, PA-C  polyethylene glycol powder (GLYCOLAX /MIRALAX ) 17 GM/SCOOP powder Take 17 g by mouth as needed for moderate constipation, mild constipation or severe constipation. 07/21/24   Gomes, Adriana, DO  rizatriptan  (MAXALT ) 5 MG tablet Take 1 tablet (5 mg total) by mouth as needed for migraine. May repeat in 2 h prn, no more than 2 pills/24 h, no more than 3 pills/week. 08/29/24   Athar, Saima, MD  sertraline (ZOLOFT) 25 MG tablet Take 25 mg by mouth daily.    [provider]  valACYclovir  (VALTREX ) 500 MG tablet Take 500 mg by mouth daily.    [provider]  metFORMIN  (GLUCOPHAGE ) 500 MG tablet Take 1 tablet (500 mg total) by mouth 2 (two) times daily with a meal. 05/07/11 12/04/11  Hershal Ozell PARAS, MD    Allergies: Other, Trichophyton, Midazolam  hcl, Penicillins, Soy allergy (obsolete), Versed  [midazolam  hcl], Ranitidine  hcl, and Zantac  [ranitidine  hcl]    Review of Systems  Updated Vital Signs  Vitals:   09/17/24 1904 09/17/24 2100  BP: (!) 139/97 130/89  Pulse: 94 90  Resp:  18 16  Temp: 98.7 F (37.1 C) 98.7 F (37.1 C)  TempSrc: Oral Oral  SpO2: 100% 100%     Physical Exam Vitals and nursing note reviewed.  HENT:     Head: Normocephalic.     Mouth/Throat:     Mouth: Mucous membranes are moist.  Eyes:     Extraocular Movements: Extraocular movements intact.  Cardiovascular:     Rate and Rhythm: Normal rate and regular rhythm.     Heart sounds: Normal heart sounds.  Pulmonary:     Effort: Pulmonary effort is normal. No respiratory distress.     Breath sounds: Normal breath sounds.  Abdominal:     Palpations:  Abdomen is soft.     Tenderness: There is no abdominal tenderness. There is no guarding.  Musculoskeletal:     Cervical back: Normal range of motion.     Right lower leg: No edema.     Left lower leg: No edema.     Comments: Moves all extremities spontaneously without difficulty  Skin:    General: Skin is warm and dry.  Neurological:     Mental Status: She is alert and oriented to person, place, and time.     (all labs ordered are listed, but only abnormal results are displayed) Labs Reviewed - No data to display  EKG: None  Radiology: DG Chest 2 View Result Date: 09/17/2024 EXAM: 2 VIEW(S) XRAY OF THE CHEST 09/17/2024 07:34:00 PM COMPARISON: Comparison with 04/30/2023. CLINICAL HISTORY: Cough, congestion, and body aches starting today. History of asthma. FINDINGS: LUNGS AND PLEURA: Lungs are clear. No pleural effusion or pneumothorax. HEART AND MEDIASTINUM: Heart size and pulmonary vascularity are normal. Mediastinal contours appear intact. BONES AND SOFT TISSUES: No acute osseous abnormality. IMPRESSION: 1. No acute cardiopulmonary abnormality. Electronically signed by: Elsie Gravely MD 09/17/2024 07:48 PM EST RP Workstation: [id number]     Procedures   Medications Ordered in the ED - No data to display                                  Medical Decision Making This patient presents to the ED for concern of flulike symptoms, this involves an extensive number of treatment options, and is a complaint that carries with it a high risk of complications and morbidity.  The differential diagnosis includes COVID/flu/RSV, other viral respiratory illness, pneumonia, asthma exacerbation   Co morbidities that complicate the patient evaluation  Asthma  Imaging Studies ordered:  I ordered imaging studies including CXR  I independently visualized and interpreted imaging which showed no acute cardiopulmonary abnormality I agree with the radiologist interpretation   Cardiac  Monitoring: / EKG:  The patient was maintained on a cardiac monitor.  I personally viewed and interpreted the cardiac monitored which showed an underlying rhythm of: NSR   Problem List / ED Course / Critical interventions / Medication management I have reviewed the patients home medicines and have made adjustments as needed   Test / Admission - Considered:  Physical exam unremarkable as above, patient is well-appearing and in no acute distress, she is afebrile and without tachycardia, lungs are clear to auscultation bilaterally without adventitious sounds.  Chest x-ray does not show any evidence of consolidation or infiltrate.  Patient took at home COVID/flu test, which was negative, testing was not repeated in the emergency department today.  Patient symptoms coincide with a viral respiratory illness, I recommend that she continue supportive measures  including Tylenol /ibuprofen  for fever/body aches, will prescribe Tessalon  to be used as needed for cough.  Patient also complains of nausea but no vomiting, will prescribe Zofran .  Return precautions discussed, patient voiced understanding and is in agreement this plan, she is appropriate for discharge at this time.    Amount and/or Complexity of Data Reviewed Radiology: ordered.  Risk Prescription drug management.        Final diagnoses:  Flu-like symptoms    ED Discharge Orders          Ordered    benzonatate  (TESSALON ) 100 MG capsule  Every 8 hours        09/17/24 2107    ondansetron  (ZOFRAN -ODT) 4 MG disintegrating tablet  Every 8 hours PRN        09/17/24 2107               Glendia Rocky SAILOR, [place] 09/17/24 2334  "

## 2024-09-17 NOTE — ED Triage Notes (Signed)
 Pt c/o flu-like symptoms onset today. OTC meds for symptoms, minimal relief of symptoms. Reports similar URI approx 3wks ago, neg covid/ flu

## 2024-09-17 NOTE — Discharge Instructions (Signed)
 Continue Tylenol /ibuprofen  as needed for headache/body aches/fever.  I have prescribed you a medication for cough, start Tessalon  and take 1 capsule by mouth every 8 hours as needed for cough.  Start Zofran  and use every 8 hours as needed for nausea.  Follow-up with your PCP if your symptoms persist, return to the emergency department if your symptoms worsen.

## 2024-09-21 NOTE — Progress Notes (Signed)
 See procedure note.

## 2024-09-22 ENCOUNTER — Ambulatory Visit: Payer: Self-pay | Admitting: Neurology

## 2024-09-22 NOTE — Procedures (Signed)
"   [facility]  HOME SLEEP TEST (SANSA) REPORT (Mail-Out Device):   STUDY DATE: 09/16/2024  DOB: Mar 10, 2001  MRN: [id number]  ORDERING CLINICIAN: True Mar, MD, PhD   REFERRING CLINICIAN: Jorene Moats, [place]  CLINICAL INFORMATION/HISTORY (obtained from visit note dated 08/29/2024): 24 year old female with an underlying medical history of acne, anxiety, asthma, depression, eczema, goiter, obesity, recent status post childbirth in September 2025, who reports recurrent headaches.  She does not sleep well and is reported to snore.    PATIENT'S LAST REPORTED EPWORTH SLEEPINESS SCORE (ESS): 9/24.  BMI (at the time of sleep clinic visit and/or test date): 33.9 kg/m  FINDINGS:   Study Protocol:    The SANSA single-point-of-skin-contact chest-worn sensor - an FDA cleared and DOT approved type 4 home sleep test device - measures eight physiological channels,  including blood oxygen saturation (measured via PPG [photoplethysmography]), EKG-derived heart rate, respiratory effort, chest movement (measured via accelerometer), snoring, body position, and actigraphy. The device is designed to be worn for up to 10 hours per study.   Sleep Summary:   Total Recording Time (hours, min): 7 hours, 2 min  Total Effective Sleep Time (hours, min):  5 hours, 52 min  Sleep Efficiency (%):    84%   Respiratory Indices:   Calculated sAHI (per hour):  4.7/hour        Calculated sAHIc (central AHI per hour):  0.7/hour  Oxygen Saturation Statistics:    Oxygen Saturation (%) Mean: 93.7%   Minimum oxygen saturation (%):                 83.9%   O2 Saturation Range (%): 83.9-99.9%   Time below or at 88% saturation: <1 min   Pulse Rate Statistics:   Pulse Mean (bpm):    67/min    Pulse Range (54-101/min)   Snoring: Mild, intermittent  IMPRESSION/DIAGNOSES:    Primary snoring   RECOMMENDATIONS:   This home sleep test does not demonstrate any significant obstructive or  central sleep disordered breathing with a total AHI of less than 5/hour. Her total AHI was 4.7/hour, O2 nadir of 83.9% (without any significant time below or at 88% saturation for the night of less than 1 minute).  Snoring was detected, intermittently in the mild range. Treatment with a positive airway pressure device such as AutoPap or CPAP is not indicated based on this test. Snoring may improve with avoidance of the supine sleep position and weight loss (where clinically appropriate).   For disturbing snoring, an oral appliance through dentistry or orthodontics can be considered.  Other causes of the patient's symptoms, including circadian rhythm disturbances, an underlying mood disorder, medication effect and/or an underlying medical problem cannot be ruled out based on this test. Clinical correlation is recommended.  The patient should be cautioned not to drive, work at heights, or operate dangerous or heavy equipment when tired or sleepy. Review and reiteration of good sleep hygiene measures should be pursued with any patient. The patient will be advised to follow up with her referring provider, who will be notified of the test results.   I certify that I have reviewed the raw data recording prior to the issuance of this report in accordance with the standards of the American Academy of Sleep Medicine (AASM).    INTERPRETING PHYSICIAN:   True Mar, MD, PhD Medical Director, [facility]) Diplomat, ABPN (Neurology and Sleep)   [facility] [address] [place], [place] [address] [phone]  [phone] ° ° ° ° ° ° ° ° ° ° ° °"

## 2024-09-29 ENCOUNTER — Ambulatory Visit: Payer: MEDICAID

## 2024-09-29 VITALS — BP 139/95 | HR 66 | Wt 202.8 lb

## 2024-09-29 DIAGNOSIS — Z9109 Other allergy status, other than to drugs and biological substances: Secondary | ICD-10-CM | POA: Diagnosis not present

## 2024-09-29 DIAGNOSIS — R059 Cough, unspecified: Secondary | ICD-10-CM | POA: Diagnosis not present

## 2024-09-29 MED ORDER — MONTELUKAST SODIUM 10 MG PO TABS: 10.0000 mg | ORAL_TABLET | Freq: Every day | ORAL | 0 refills | Status: DC

## 2024-09-29 NOTE — Patient Instructions (Addendum)
 I have sent a medicine to your pharmacy called monelukast (singular). Take this medication once daily, at nighttime.

## 2024-09-29 NOTE — Progress Notes (Signed)
" ° ° °  SUBJECTIVE:   CHIEF COMPLAINT / HPI: cough   Cough for 2 months. She got sick back to back. Body aches, diarrhea, fever up to 102, chills, HA after she was seen in the ED on 09/17/24. All of those symptoms have resolved except the cough.   Coughing up mucus green. Runny nose, post nasal drip. Chest pain worse when coughing. Has needed her albuterol  5 times in the last week, 2-3 times during the day. No time of day is worse for her cough.   PERTINENT  PMH / PSH: Asthma   OBJECTIVE:   BP (!) 141/91   Pulse 66   Wt 202 lb 12.8 oz (92 kg)   LMP 09/19/2024 (Approximate)   SpO2 100%   Breastfeeding No   BMI 33.75 kg/m   General: NAD HENT: normocephalic, R ear unable to visualize TM due to cerumen, L ear TM normal, nasal passages without discharge, throat non-erythematous, non-edematous  Neck: no lymphadenopathy, no thyroid  nodules  Cardiovascular: RRR, no M/R/G Respiratory: CTAB, normal work of breathing, no wheezing   ASSESSMENT/PLAN:   Assessment & Plan Environmental allergies Cough, unspecified type - prescribed montelukast  10 mg at bedtime  - educated patient that cough can last for up to 2 months - recommended she can try hot tea with honey and/or OTC cough medications as well      Deanna KANDICE Lee, DO [facility] "

## 2024-10-19 ENCOUNTER — Ambulatory Visit: Payer: MEDICAID | Admitting: Family Medicine

## 2024-10-19 ENCOUNTER — Encounter: Payer: Self-pay | Admitting: Family Medicine

## 2024-10-19 NOTE — Patient Instructions (Signed)
 It was great to see you again today.  Re-referring to GI and pelvic floor physical therapy Take miralax  daily until stools are moving well  Be well, Dr. Donah

## 2024-10-19 NOTE — Progress Notes (Unsigned)
"  °  Date of Visit: 10/19/2024   SUBJECTIVE:   HPI:  Discussed the use of AI scribe software for clinical note transcription with the patient, who gave verbal consent to proceed.  History of Present Illness       OBJECTIVE:   BP 132/79   Pulse 73   Ht 5' 5 (1.651 m)   Wt 204 lb 12.8 oz (92.9 kg)   LMP 09/19/2024 (Approximate)   SpO2 100%   BMI 34.08 kg/m  Gen: *** HEENT: *** Heart: *** Lungs: *** Neuro: *** Ext: ***  ASSESSMENT/PLAN:   Assessment & Plan      Assessment and Plan Assessment & Plan       FOLLOW UP: Follow up in *** for ***  Zina Pitzer J. Donah, MD [facility] "
